# Patient Record
Sex: Female | Born: 1985 | Hispanic: No | Marital: Married | Smoking: Never smoker
Health system: Southern US, Community
[De-identification: ages and names within clinical notes are randomized; demographics above are authoritative.]

## PROBLEM LIST (undated history)

## (undated) DIAGNOSIS — I10 Essential (primary) hypertension: Secondary | ICD-10-CM

## (undated) DIAGNOSIS — O24419 Gestational diabetes mellitus in pregnancy, unspecified control: Secondary | ICD-10-CM

## (undated) DIAGNOSIS — K219 Gastro-esophageal reflux disease without esophagitis: Secondary | ICD-10-CM

## (undated) DIAGNOSIS — D649 Anemia, unspecified: Secondary | ICD-10-CM

## (undated) DIAGNOSIS — D561 Beta thalassemia: Secondary | ICD-10-CM

## (undated) HISTORY — DX: Beta thalassemia: D56.1

## (undated) HISTORY — DX: Gestational diabetes mellitus in pregnancy, unspecified control: O24.419

## (undated) HISTORY — DX: Gastro-esophageal reflux disease without esophagitis: K21.9

## (undated) HISTORY — DX: Essential (primary) hypertension: I10

## (undated) HISTORY — DX: Anemia, unspecified: D64.9

---

## 2020-08-05 NOTE — L&D Delivery Note (Addendum)
Delivery Note  Candice Walker is a G7P6 at [redacted]w[redacted]d with an LMP of 08/23/20, inconsistent with [place] at [redacted]w[redacted]d.   First Stage: Labor onset: 04/28/21@2210  Induction: misoprostol, oxytocin, AROM, and cervical balloon Analgesia /Anesthesia intrapartum: Epidural AROM at 0029  Second Stage: Complete dilation at 0738 Onset of pushing at 0751 FHR second stage 145 with variable x1  Delivery of a viable baby boy on 04/29/2021  at 0758 by CNM/ Dr Dalbert Garnet present Delivery of fetal head in OA position with restitution to LOT. reducible nuchal cord x1;  Anterior then posterior shoulders delivered easily with gentle downward traction. Baby placed on mom's chest, and attended to by baby RN Cord double clamped after cessation of pulsation, cut by FOB  Cord blood sample collected: yes Collection of cord blood donation N/A Arterial cord blood sample N/A  Third Stage: Oxytocin bolus started after delivery of infant for hemorrhage prophylaxis  Placenta delivered spontaneously intact with 3 VC @ 0802 Placenta disposition: discarded Uterine tone firm / bleeding small  no laceration identified  Anesthesia for repair: N/A Repair N/A Est. Blood Loss (mL): 250  Complications: none  Mom to postpartum.  Baby to Couplet care / Skin to Skin.  Newborn: Information for the patient's newborn:  Candice, Walker [[id number]]  Live born female  Birth Weight:   APGAR: 8, 9  Newborn Delivery   Birth date/time: 04/29/2021 07:58:00 Delivery type: Vaginal, Spontaneous        Feeding planned: Breast  ---------- Chari Manning, CNM Certified Nurse Midwife [facility]

## 2020-10-12 LAB — OB RESULTS CONSOLE HIV ANTIBODY (ROUTINE TESTING): HIV: NONREACTIVE

## 2021-01-03 DIAGNOSIS — Z419 Encounter for procedure for purposes other than remedying health state, unspecified: Secondary | ICD-10-CM | POA: Diagnosis not present

## 2021-01-05 DIAGNOSIS — O99012 Anemia complicating pregnancy, second trimester: Secondary | ICD-10-CM | POA: Diagnosis not present

## 2021-01-05 DIAGNOSIS — O10912 Unspecified pre-existing hypertension complicating pregnancy, second trimester: Secondary | ICD-10-CM | POA: Diagnosis not present

## 2021-01-05 DIAGNOSIS — O09892 Supervision of other high risk pregnancies, second trimester: Secondary | ICD-10-CM | POA: Diagnosis not present

## 2021-01-05 DIAGNOSIS — Z3482 Encounter for supervision of other normal pregnancy, second trimester: Secondary | ICD-10-CM | POA: Diagnosis not present

## 2021-01-05 DIAGNOSIS — R1084 Generalized abdominal pain: Secondary | ICD-10-CM | POA: Diagnosis not present

## 2021-01-05 DIAGNOSIS — Z8759 Personal history of other complications of pregnancy, childbirth and the puerperium: Secondary | ICD-10-CM | POA: Diagnosis not present

## 2021-01-05 DIAGNOSIS — Z114 Encounter for screening for human immunodeficiency virus [HIV]: Secondary | ICD-10-CM | POA: Diagnosis not present

## 2021-01-05 LAB — OB RESULTS CONSOLE RUBELLA ANTIBODY, IGM: Rubella: IMMUNE

## 2021-01-05 LAB — OB RESULTS CONSOLE HEPATITIS B SURFACE ANTIGEN: Hepatitis B Surface Ag: NEGATIVE

## 2021-01-05 LAB — OB RESULTS CONSOLE VARICELLA ZOSTER ANTIBODY, IGG: Varicella: IMMUNE

## 2021-01-05 LAB — OB RESULTS CONSOLE HIV ANTIBODY (ROUTINE TESTING): HIV: NONREACTIVE

## 2021-01-09 DIAGNOSIS — O09899 Supervision of other high risk pregnancies, unspecified trimester: Secondary | ICD-10-CM | POA: Insufficient documentation

## 2021-01-11 DIAGNOSIS — D649 Anemia, unspecified: Secondary | ICD-10-CM | POA: Diagnosis not present

## 2021-01-11 DIAGNOSIS — O09892 Supervision of other high risk pregnancies, second trimester: Secondary | ICD-10-CM | POA: Diagnosis not present

## 2021-01-11 DIAGNOSIS — Z131 Encounter for screening for diabetes mellitus: Secondary | ICD-10-CM | POA: Diagnosis not present

## 2021-01-11 DIAGNOSIS — Z1329 Encounter for screening for other suspected endocrine disorder: Secondary | ICD-10-CM | POA: Diagnosis not present

## 2021-01-11 DIAGNOSIS — O99012 Anemia complicating pregnancy, second trimester: Secondary | ICD-10-CM | POA: Diagnosis not present

## 2021-01-11 DIAGNOSIS — Z8759 Personal history of other complications of pregnancy, childbirth and the puerperium: Secondary | ICD-10-CM | POA: Diagnosis not present

## 2021-01-17 ENCOUNTER — Inpatient Hospital Stay: Payer: Medicaid Other

## 2021-01-17 ENCOUNTER — Inpatient Hospital Stay: Payer: Medicaid Other | Admitting: Oncology

## 2021-02-01 ENCOUNTER — Encounter (INDEPENDENT_AMBULATORY_CARE_PROVIDER_SITE_OTHER): Payer: Self-pay

## 2021-02-01 ENCOUNTER — Inpatient Hospital Stay: Payer: Medicaid Other | Attending: Nurse Practitioner | Admitting: Nurse Practitioner

## 2021-02-01 ENCOUNTER — Inpatient Hospital Stay: Payer: Medicaid Other

## 2021-02-01 ENCOUNTER — Encounter: Payer: Self-pay | Admitting: Nurse Practitioner

## 2021-02-01 VITALS — BP 130/78 | HR 95 | Temp 99.0°F | Resp 16 | Ht 61.0 in | Wt 136.5 lb

## 2021-02-01 DIAGNOSIS — Z3A25 25 weeks gestation of pregnancy: Secondary | ICD-10-CM

## 2021-02-01 DIAGNOSIS — O99012 Anemia complicating pregnancy, second trimester: Secondary | ICD-10-CM | POA: Insufficient documentation

## 2021-02-01 DIAGNOSIS — D649 Anemia, unspecified: Secondary | ICD-10-CM

## 2021-02-01 DIAGNOSIS — D509 Iron deficiency anemia, unspecified: Secondary | ICD-10-CM | POA: Insufficient documentation

## 2021-02-01 LAB — CBC WITH DIFFERENTIAL/PLATELET
Abs Immature Granulocytes: 0.07 10*3/uL (ref 0.00–0.07)
Basophils Absolute: 0 10*3/uL (ref 0.0–0.1)
Basophils Relative: 0 %
Eosinophils Absolute: 0.1 10*3/uL (ref 0.0–0.5)
Eosinophils Relative: 1 %
HCT: 27.8 % — ABNORMAL LOW (ref 36.0–46.0)
Hemoglobin: 8.8 g/dL — ABNORMAL LOW (ref 12.0–15.0)
Immature Granulocytes: 1 %
Lymphocytes Relative: 26 %
Lymphs Abs: 2 10*3/uL (ref 0.7–4.0)
MCH: 19.4 pg — ABNORMAL LOW (ref 26.0–34.0)
MCHC: 31.7 g/dL (ref 30.0–36.0)
MCV: 61.2 fL — ABNORMAL LOW (ref 80.0–100.0)
Monocytes Absolute: 0.6 10*3/uL (ref 0.1–1.0)
Monocytes Relative: 7 %
Neutro Abs: 4.9 10*3/uL (ref 1.7–7.7)
Neutrophils Relative %: 65 %
Platelets: 190 10*3/uL (ref 150–400)
RBC: 4.54 MIL/uL (ref 3.87–5.11)
RDW: 17.5 % — ABNORMAL HIGH (ref 11.5–15.5)
WBC: 7.6 10*3/uL (ref 4.0–10.5)
nRBC: 0 % (ref 0.0–0.2)

## 2021-02-01 LAB — COMPREHENSIVE METABOLIC PANEL
ALT: 10 U/L (ref 0–44)
AST: 17 U/L (ref 15–41)
Albumin: 3.4 g/dL — ABNORMAL LOW (ref 3.5–5.0)
Alkaline Phosphatase: 57 U/L (ref 38–126)
Anion gap: 6 (ref 5–15)
BUN: 11 mg/dL (ref 6–20)
CO2: 20 mmol/L — ABNORMAL LOW (ref 22–32)
Calcium: 8.6 mg/dL — ABNORMAL LOW (ref 8.9–10.3)
Chloride: 105 mmol/L (ref 98–111)
Creatinine, Ser: 0.52 mg/dL (ref 0.44–1.00)
GFR, Estimated: >60 mL/min (ref >60)
Glucose, Bld: 126 mg/dL — ABNORMAL HIGH (ref 70–99)
Potassium: 3.3 mmol/L — ABNORMAL LOW (ref 3.5–5.1)
Sodium: 131 mmol/L — ABNORMAL LOW (ref 135–145)
Total Bilirubin: 0.6 mg/dL (ref 0.3–1.2)
Total Protein: 6.8 g/dL (ref 6.5–8.1)

## 2021-02-01 LAB — RETICULOCYTES
Immature Retic Fract: 31.3 % — ABNORMAL HIGH (ref 2.3–15.9)
RBC.: 4.49 MIL/uL (ref 3.87–5.11)
Retic Count, Absolute: 135.1 10*3/uL (ref 19.0–186.0)
Retic Ct Pct: 3 % (ref 0.4–3.1)

## 2021-02-01 LAB — IRON AND TIBC
Iron: 160 ug/dL (ref 28–170)
Saturation Ratios: 33 % — ABNORMAL HIGH (ref 10.4–31.8)
TIBC: 480 ug/dL — ABNORMAL HIGH (ref 250–450)
UIBC: 320 ug/dL

## 2021-02-01 LAB — FERRITIN: Ferritin: 9 ng/mL — ABNORMAL LOW (ref 11–307)

## 2021-02-01 LAB — LACTATE DEHYDROGENASE: LDH: 100 U/L (ref 98–192)

## 2021-02-01 LAB — DAT, POLYSPECIFIC AHG (ARMC ONLY): Polyspecific AHG test: NEGATIVE

## 2021-02-01 LAB — VITAMIN B12: Vitamin B-12: 284 pg/mL (ref 180–914)

## 2021-02-01 LAB — FOLATE: Folate: 56 ng/mL (ref >5.9)

## 2021-02-01 LAB — TYPE AND SCREEN
ABO/RH(D): O POS
Antibody Screen: NEGATIVE

## 2021-02-01 LAB — SEDIMENTATION RATE: Sed Rate: 33 mm/hr — ABNORMAL HIGH (ref 0–20)

## 2021-02-01 NOTE — Progress Notes (Signed)
[facility] at [facility] [address] [place], [place] [address] [phone] (phone) [phone] (fax)  Clinic Day:  02/01/2021  Referring physician: Randa Ngo, CNM   CHIEF COMPLAINT:  CC: Anemia  HISTORY OF PRESENT ILLNESS:  Candice Walker is a 35 y.o. female with a history of anemia, hypertension, beta thalassemia trait, gerd, who is referred in consultation with Candice Walker, CNM for assessment and management of anemia. Patient is a refugee from [place] and curerntly living locally and has a local host family, who accompanies her today and contributes to history. Patient is currently [redacted] weeks pregnant with estimated delivery date of 05/17/21. She is [id number]. Has been anemic in previous pregnancies and out of pregnancy. She has been seen at [facility] in high risk pregnancy clinic. She has a history of heavy menstrual periods as well. Per patient she has a history of thalassemia. She has previously taken oral iron but was unable to tolerate due to Gi side effects. Endorses restless leg & cramping. Endorses previous pica: ice and dirt. No melena or hematochezia. No dizziness or falls. No recent fevers or illness. No easy bleeding or bruising. Appetite is good and she denies weight loss. Denies chest pain. Shortness of breath with exertion. Denies nausea, vomiting, constipation, or diarrhea. Denies urinary complaints. Denies other specific complaints today.   REVIEW OF SYSTEMS:  Review of Systems  Constitutional:  Positive for fatigue. Negative for appetite change and unexpected weight change.  HENT:   Negative for mouth sores, sore throat and trouble swallowing.   Respiratory:  Positive for shortness of breath. Negative for chest tightness and cough.   Cardiovascular:  Negative for leg swelling and palpitations.  Gastrointestinal:  Negative for abdominal pain, constipation, diarrhea, nausea and vomiting.  Genitourinary:  Negative for bladder  incontinence, dysuria, hematuria, menstrual problem, pelvic pain, vaginal bleeding and vaginal discharge.   Musculoskeletal:  Negative for flank pain and neck stiffness.  Skin:  Negative for itching, rash and wound.  Neurological:  Negative for dizziness, extremity weakness, headaches, light-headedness and numbness.  Hematological:  Negative for adenopathy. Does not bruise/bleed easily.  Psychiatric/Behavioral:  Negative for confusion, depression and sleep disturbance. The patient is not nervous/anxious.     History:    Past Medical History:  Diagnosis Date   Anemia    Beta thalassemia (HCC)    GERD (gastroesophageal reflux disease)    Hypertension    History reviewed. No pertinent surgical history.  Family History  Problem Relation Age of Onset   Hypertension Mother    OB History     Gravida  7   Para  6   Term      Preterm      AB      Living  6      SAB      IAB      Ectopic      Multiple      Live Births             Current Outpatient Medications on File Prior to Visit  Medication Sig Dispense Refill   aspirin 81 MG EC tablet Take 1 tablet by mouth.     Blood Pressure Monitor MISC To monitor blood pressure at home during pregnancy.     calcium carbonate (TUMS EX) 750 MG chewable tablet Chew by mouth.     fluticasone (FLONASE) 50 MCG/ACT nasal spray Place into the nose.     folic acid (FOLVITE) 1 MG tablet Take  by mouth.     labetalol (NORMODYNE) 100 MG tablet Take by mouth.     omeprazole (PRILOSEC) 40 MG capsule Take 1 capsule by mouth.     Prenatal 27-1 MG TABS 1 tablet.     No current facility-administered medications on file prior to visit.   No Known Allergies  Social History   Socioeconomic History   Marital status: Married    Spouse name: Not on file   Number of children: 6   Years of education: Not on file   Highest education level: Not on file  Occupational History   Not on file  Tobacco Use   Smoking status: Never    Passive  exposure: Never   Smokeless tobacco: Never  Vaping Use   Vaping Use: Never used  Substance and Sexual Activity   Alcohol use: Never   Drug use: Never   Sexual activity: Yes    Birth control/protection: None  Other Topics Concern   Not on file  Social History Narrative   Patient previously lived in [place] and left as refugee. Resided in refugee camp in [place], then in [place] and now lives in [place] with a host family along with her husband and children.    Social Determinants of Health   Financial Resource Strain: Not on file  Food Insecurity: Not on file  Transportation Needs: Not on file  Physical Activity: Not on file  Stress: Not on file  Social Connections: Not on file     VITALS:  Blood pressure 130/78, pulse 95, temperature 99 F (37.2 C), temperature source Tympanic, resp. rate 16, height 5\' 1"  (1.549 m), weight 136 lb 8 oz (61.9 kg), SpO2 100 %.  Wt Readings from Last 3 Encounters:  02/01/21 136 lb 8 oz (61.9 kg)    Body mass index is 25.79 kg/m.  Performance status (ECOG): 2 - Symptomatic, <50% confined to bed  PHYSICAL EXAM:  Physical Exam Vitals and nursing note reviewed.  Constitutional:      Appearance: She is not ill-appearing.  HENT:     Head: Normocephalic.  Eyes:     General: No scleral icterus.    Conjunctiva/sclera: Conjunctivae normal.  Cardiovascular:     Rate and Rhythm: Normal rate and regular rhythm.     Pulses: Normal pulses.  Pulmonary:     Effort: Pulmonary effort is normal. No respiratory distress.     Breath sounds: Normal breath sounds.  Abdominal:     General: There is no distension.     Tenderness: There is no abdominal tenderness. There is no guarding.     Comments: gravid  Musculoskeletal:        General: No swelling or deformity.     Cervical back: Neck supple.     Right lower leg: No edema.     Left lower leg: No edema.  Lymphadenopathy:     Cervical: No cervical adenopathy.  Skin:    General: Skin is warm and  dry.     Coloration: Skin is pale.     Findings: No bruising or rash.  Neurological:     General: No focal deficit present.     Mental Status: She is alert and oriented to person, place, and time.     Gait: Gait normal.  Psychiatric:        Behavior: Behavior normal.        Thought Content: Thought content normal.        Judgment: Judgment normal.    LABS:  CBC Latest Ref Rng & Units 02/01/2021  WBC 4.0 - 10.5 K/uL 7.6  Hemoglobin 12.0 - 15.0 g/dL 8.6(P)  Hematocrit 61.9 - 46.0 % 27.8(L)  Platelets 150 - 400 K/uL 190   CMP Latest Ref Rng & Units 02/01/2021  Glucose 70 - 99 mg/dL 509(T)  BUN 6 - 20 mg/dL 11  Creatinine 2.67 - 1.24 mg/dL 5.80  Sodium 998 - 338 mmol/L 131(L)  Potassium 3.5 - 5.1 mmol/L 3.3(L)  Chloride 98 - 111 mmol/L 105  CO2 22 - 32 mmol/L 20(L)  Calcium 8.9 - 10.3 mg/dL 2.5(K)  Total Protein 6.5 - 8.1 g/dL 6.8  Total Bilirubin 0.3 - 1.2 mg/dL 0.6  Alkaline Phos 38 - 126 U/L 57  AST 15 - 41 U/L 17  ALT 0 - 44 U/L 10   No results found for: CEA1 / No results found for: CEA1 No results found for: PSA1 No results found for: NLZ767 No results found for: HAL937  Lab Results  Component Value Date   TOTALPROTELP 6.1 02/01/2021   ALBUMINELP 3.3 02/01/2021   A1GS 0.3 02/01/2021   A2GS 0.7 02/01/2021   BETS 1.1 02/01/2021   GAMS 0.7 02/01/2021   MSPIKE Not Observed 02/01/2021   SPEI Comment 02/01/2021   Lab Results  Component Value Date   TIBC 480 (H) 02/01/2021   FERRITIN 9 (L) 02/01/2021   IRONPCTSAT 33 (H) 02/01/2021   Lab Results  Component Value Date   LDH 100 02/01/2021    STUDIES:  No results found.   ASSESSMENT & PLAN:   Assessment:  Candice Walker is a 35 y.o. female with anemia of pregnancy who presents for new patient evaluation.   Anemia: today we reviewed possible etiologies of anemia including possibly multifactorial with possible causes including chronic blood loss, hyper/hypothyroidism, nutritional deficiency, infection/chronic  inflammation, hemolysis, underlying bone marrow disorders. Will plan for labs today: CBC w differential, CMP, vitamin B12, Folate, iron/TIBC, ferritin, reticulocytes, fecal occult, blood smear, TSH,  LDH, haptoglobin, monoclonal gammopathy evaluation, DAT, Hgb fractionation cascade to evaluate for thalassemia, ANA. 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 However, patient is also significantly iron deficient. Iron deficiency can skew the hemoglobin pattern of thalassemia and should be corrected prior to interpreting thalassemia syndrome. Hemoglobin 8.8 consistent with anemia, microcytic and hypochromic. RDW is elevated which is more consistent with IDA compared to thalassemia minor. Additionally, ferritin and tsat are low consistent with IDA. Once iron deficiency is corrected, would recommend re-evaluating for thalassemia.  Given symptoms and poor response to oral iron I recommend rapid repletion of iron stores. Risks vs benefits and alternatives were discussed in detail. Based on insurance, recommend venofer x 5 doses. Will then plan to check her counts in 8 weeks and have her see MD to establish care.   Plan: Labs today Venofer x 5 8 weeks - labs (cbc, ferritin, iron studies, retic panel), MD to establish care, possible venofer  Due to a language barrier, an interpreter was utilized for all patient interactions.   I discussed the assessment and treatment plan with the patient.  The patient was provided an opportunity to ask questions and all were answered.  The patient agreed with the plan and demonstrated an understanding of the instructions.  The patient was advised to call back if the symptoms worsen or if the condition fails to improve as anticipated.  Thank you for the opportunity to participate in the care of this very pleasant patient.    I spent 80 minutes face-to-face  visit time dedicated to the care of this patient on the date of this encounter to including pre-visit review  of gynecology notes, face-to-face time with the patient, and post visit ordering of testing/documentation.    Consuello Masse, DNP, AGNP-C Cancer Center at [facility]

## 2021-02-01 NOTE — Progress Notes (Signed)
Recent immigration from [place]. Language barrier. Used electronic interpreter ID [id number]. Pt has 6 children at home ages 80-14. Currently 6 months pregnant. Long history of anemia. Does state she has some lightheadedness, heart beats fast. Denies any visible bleeding from vagina or rectal bleeding. Reports back and leg pains. Feels has problem with constipation, a friend from local church is with patient today.

## 2021-02-02 DIAGNOSIS — Z419 Encounter for procedure for purposes other than remedying health state, unspecified: Secondary | ICD-10-CM | POA: Diagnosis not present

## 2021-02-02 LAB — PROTEIN ELECTROPHORESIS, SERUM
A/G Ratio: 1.2 (ref 0.7–1.7)
Albumin ELP: 3.3 g/dL (ref 2.9–4.4)
Alpha-1-Globulin: 0.3 g/dL (ref 0.0–0.4)
Alpha-2-Globulin: 0.7 g/dL (ref 0.4–1.0)
Beta Globulin: 1.1 g/dL (ref 0.7–1.3)
Gamma Globulin: 0.7 g/dL (ref 0.4–1.8)
Globulin, Total: 2.8 g/dL (ref 2.2–3.9)
Total Protein ELP: 6.1 g/dL (ref 6.0–8.5)

## 2021-02-02 LAB — ERYTHROPOIETIN: Erythropoietin: 28.5 m[IU]/mL — ABNORMAL HIGH (ref 2.6–18.5)

## 2021-02-02 LAB — ANA W/REFLEX: Anti Nuclear Antibody (ANA): NEGATIVE

## 2021-02-02 LAB — HAPTOGLOBIN: Haptoglobin: 37 mg/dL (ref 33–278)

## 2021-02-05 LAB — HGB FRACTIONATION CASCADE
Hgb A2: 5.1 % — ABNORMAL HIGH (ref 1.8–3.2)
Hgb A: 94.9 % — ABNORMAL LOW (ref 96.4–98.8)
Hgb F: 0 % (ref 0.0–2.0)
Hgb S: 0 %

## 2021-02-07 ENCOUNTER — Encounter: Payer: Self-pay | Admitting: Nurse Practitioner

## 2021-02-07 DIAGNOSIS — O99019 Anemia complicating pregnancy, unspecified trimester: Secondary | ICD-10-CM | POA: Insufficient documentation

## 2021-02-07 DIAGNOSIS — D509 Iron deficiency anemia, unspecified: Secondary | ICD-10-CM | POA: Insufficient documentation

## 2021-02-08 ENCOUNTER — Inpatient Hospital Stay: Payer: Medicaid Other | Attending: Nurse Practitioner

## 2021-02-08 ENCOUNTER — Other Ambulatory Visit: Payer: Self-pay

## 2021-02-08 VITALS — BP 141/73 | HR 90

## 2021-02-08 DIAGNOSIS — D649 Anemia, unspecified: Secondary | ICD-10-CM

## 2021-02-08 DIAGNOSIS — D509 Iron deficiency anemia, unspecified: Secondary | ICD-10-CM | POA: Diagnosis not present

## 2021-02-08 MED ORDER — SODIUM CHLORIDE 0.9 % IV SOLN: 200.0000 mg | Freq: Once | INTRAVENOUS | Status: DC

## 2021-02-08 MED ORDER — SODIUM CHLORIDE 0.9 % IV SOLN
Freq: Once | INTRAVENOUS | Status: AC
  Filled 2021-02-08: qty 250

## 2021-02-08 MED ORDER — IRON SUCROSE 20 MG/ML IV SOLN
200.0000 mg | Freq: Once | INTRAVENOUS | Status: AC
Start: 2021-02-08 — End: 2021-02-08
  Administered 2021-02-08: 200 mg via INTRAVENOUS
  Filled 2021-02-08: qty 10

## 2021-02-08 NOTE — Patient Instructions (Addendum)
CANCER CENTER [facility] REGIONAL MEDICAL ONCOLOGY  Discharge Instructions: Thank you for choosing [facility] to provide your oncology and hematology care.  If you have a lab appointment with the Cancer Center, please go directly to the Cancer Center and check in at the registration area.  Wear comfortable clothing and clothing appropriate for easy access to any Portacath or PICC line.   We strive to give you quality time with your provider. You may need to reschedule your appointment if you arrive late (15 or more minutes).  Arriving late affects you and other patients whose appointments are after yours.  Also, if you miss three or more appointments without notifying the office, you may be dismissed from the clinic at the provider's discretion.      For prescription refill requests, have your pharmacy contact our office and allow 72 hours for refills to be completed.    You received Venofer in our office today   Should you have questions after your visit or need to cancel or reschedule your appointment, please contact CANCER CENTER [facility] REGIONAL MEDICAL ONCOLOGY  [phone] and follow the prompts.  Office hours are 8:00 a.m. to 4:30 p.m. Monday - Friday. Please note that voicemails left after 4:00 p.m. may not be returned until the following business day.  We are closed weekends and major holidays. You have access to a nurse at all times for urgent questions. Please call the main number to the clinic [phone] and follow the prompts.  For any non-urgent questions, you may also contact your provider using MyChart. We now offer e-Visits for anyone 24 and older to request care online for non-urgent symptoms. For details visit [email or web address].   Also download the MyChart app! Go to the app store, search "MyChart", open the app, select [place], and log in with your MyChart username and password.  Due to Covid, a mask is required upon entering the hospital/clinic. If  you do not have a mask, one will be given to you upon arrival. For doctor visits, patients may have 1 support person aged 60 or older with them. For treatment visits, patients cannot have anyone with them due to current Covid guidelines and our immunocompromised population.

## 2021-02-14 ENCOUNTER — Telehealth: Payer: Self-pay | Admitting: Internal Medicine

## 2021-02-14 NOTE — Telephone Encounter (Signed)
Spoke with patient's husband about rescheduling appointment for iron infusion from 7/21/to 7/22. He confirmed that was ok.

## 2021-02-15 ENCOUNTER — Other Ambulatory Visit: Payer: Self-pay

## 2021-02-15 ENCOUNTER — Inpatient Hospital Stay: Payer: Medicaid Other

## 2021-02-15 VITALS — BP 126/77 | HR 88

## 2021-02-15 DIAGNOSIS — D649 Anemia, unspecified: Secondary | ICD-10-CM

## 2021-02-15 DIAGNOSIS — D509 Iron deficiency anemia, unspecified: Secondary | ICD-10-CM | POA: Diagnosis not present

## 2021-02-15 MED ORDER — IRON SUCROSE 20 MG/ML IV SOLN
200.0000 mg | Freq: Once | INTRAVENOUS | Status: AC
  Administered 2021-02-15: 200 mg via INTRAVENOUS
  Filled 2021-02-15: qty 10

## 2021-02-15 MED ORDER — SODIUM CHLORIDE 0.9 % IV SOLN: 200.0000 mg | Freq: Once | INTRAVENOUS | Status: DC

## 2021-02-15 MED ORDER — SODIUM CHLORIDE 0.9 % IV SOLN
Freq: Once | INTRAVENOUS | Status: AC
  Filled 2021-02-15: qty 250

## 2021-02-15 NOTE — Patient Instructions (Signed)
CANCER CENTER [place] REGIONAL MEDICAL ONCOLOGY  Discharge Instructions: Thank you for choosing [facility] to provide your oncology and hematology care.  If you have a lab appointment with the Cancer Center, please go directly to the Cancer Center and check in at the registration area.  Wear comfortable clothing and clothing appropriate for easy access to any Portacath or PICC line.   We strive to give you quality time with your provider. You may need to reschedule your appointment if you arrive late (15 or more minutes).  Arriving late affects you and other patients whose appointments are after yours.  Also, if you miss three or more appointments without notifying the office, you may be dismissed from the clinic at the provider's discretion.      For prescription refill requests, have your pharmacy contact our office and allow 72 hours for refills to be completed.    Today you received the following : Venofer   To help prevent nausea and vomiting after your treatment, we encourage you to take your nausea medication as directed.  BELOW ARE SYMPTOMS THAT SHOULD BE REPORTED IMMEDIATELY: . *FEVER GREATER THAN 100.4 F (38 C) OR HIGHER . *CHILLS OR SWEATING . *NAUSEA AND VOMITING THAT IS NOT CONTROLLED WITH YOUR NAUSEA MEDICATION . *UNUSUAL SHORTNESS OF BREATH . *UNUSUAL BRUISING OR BLEEDING . *URINARY PROBLEMS (pain or burning when urinating, or frequent urination) . *BOWEL PROBLEMS (unusual diarrhea, constipation, pain near the anus) . TENDERNESS IN MOUTH AND THROAT WITH OR WITHOUT PRESENCE OF ULCERS (sore throat, sores in mouth, or a toothache) . UNUSUAL RASH, SWELLING OR PAIN  . UNUSUAL VAGINAL DISCHARGE OR ITCHING   Items with * indicate a potential emergency and should be followed up as soon as possible or go to the Emergency Department if any problems should occur.  Please show the CHEMOTHERAPY ALERT CARD or IMMUNOTHERAPY ALERT CARD at check-in to the Emergency  Department and triage nurse.  Should you have questions after your visit or need to cancel or reschedule your appointment, please contact CANCER CENTER [place] REGIONAL MEDICAL ONCOLOGY  [phone] and follow the prompts.  Office hours are 8:00 a.m. to 4:30 p.m. Monday - Friday. Please note that voicemails left after 4:00 p.m. may not be returned until the following business day.  We are closed weekends and major holidays. You have access to a nurse at all times for urgent questions. Please call the main number to the clinic [phone] and follow the prompts.  For any non-urgent questions, you may also contact your provider using MyChart. We now offer e-Visits for anyone 18 and older to request care online for non-urgent symptoms. For details visit [email or web address].   Also download the MyChart app! Go to the app store, search "MyChart", open the app, select [place], and log in with your MyChart username and password.  Due to Covid, a mask is required upon entering the hospital/clinic. If you do not have a mask, one will be given to you upon arrival. For doctor visits, patients may have 1 support person aged 18 or older with them. For treatment visits, patients cannot have anyone with them due to current Covid guidelines and our immunocompromised population.  

## 2021-02-21 DIAGNOSIS — O99012 Anemia complicating pregnancy, second trimester: Secondary | ICD-10-CM | POA: Diagnosis not present

## 2021-02-21 DIAGNOSIS — O10912 Unspecified pre-existing hypertension complicating pregnancy, second trimester: Secondary | ICD-10-CM | POA: Diagnosis not present

## 2021-02-21 DIAGNOSIS — Z23 Encounter for immunization: Secondary | ICD-10-CM | POA: Diagnosis not present

## 2021-02-21 DIAGNOSIS — Z3483 Encounter for supervision of other normal pregnancy, third trimester: Secondary | ICD-10-CM | POA: Diagnosis not present

## 2021-02-22 ENCOUNTER — Inpatient Hospital Stay: Payer: Medicaid Other

## 2021-02-22 ENCOUNTER — Encounter (INDEPENDENT_AMBULATORY_CARE_PROVIDER_SITE_OTHER): Payer: Self-pay

## 2021-02-23 ENCOUNTER — Other Ambulatory Visit: Payer: Self-pay

## 2021-02-23 ENCOUNTER — Inpatient Hospital Stay: Payer: Medicaid Other

## 2021-02-23 VITALS — BP 109/69 | HR 89 | Temp 97.4°F | Resp 17

## 2021-02-23 DIAGNOSIS — D509 Iron deficiency anemia, unspecified: Secondary | ICD-10-CM | POA: Diagnosis not present

## 2021-02-23 DIAGNOSIS — D649 Anemia, unspecified: Secondary | ICD-10-CM

## 2021-02-23 MED ORDER — SODIUM CHLORIDE 0.9 % IV SOLN
Freq: Once | INTRAVENOUS | Status: AC
  Filled 2021-02-23: qty 250

## 2021-02-23 MED ORDER — SODIUM CHLORIDE 0.9 % IV SOLN: 200.0000 mg | Freq: Once | INTRAVENOUS | Status: DC

## 2021-02-23 MED ORDER — IRON SUCROSE 20 MG/ML IV SOLN
200.0000 mg | Freq: Once | INTRAVENOUS | Status: AC
  Administered 2021-02-23: 200 mg via INTRAVENOUS
  Filled 2021-02-23: qty 10

## 2021-02-23 NOTE — Progress Notes (Signed)
Used Interpreter [employer] (ref. # [id number]) for communication during visit.

## 2021-02-27 ENCOUNTER — Other Ambulatory Visit: Payer: Self-pay | Admitting: Obstetrics and Gynecology

## 2021-02-27 ENCOUNTER — Telehealth: Payer: Self-pay | Admitting: *Deleted

## 2021-02-27 DIAGNOSIS — O09522 Supervision of elderly multigravida, second trimester: Secondary | ICD-10-CM

## 2021-02-27 DIAGNOSIS — O24419 Gestational diabetes mellitus in pregnancy, unspecified control: Secondary | ICD-10-CM

## 2021-02-27 DIAGNOSIS — O99019 Anemia complicating pregnancy, unspecified trimester: Secondary | ICD-10-CM

## 2021-02-27 DIAGNOSIS — D509 Iron deficiency anemia, unspecified: Secondary | ICD-10-CM

## 2021-02-27 DIAGNOSIS — O162 Unspecified maternal hypertension, second trimester: Secondary | ICD-10-CM

## 2021-02-27 NOTE — Telephone Encounter (Signed)
Patty called reporting that patient has now been referred to the high risk maternity clinic due to elevated glucose and need to be on insulin injections as well as her hypertension. She was asking which appts would take precedence the iron infusion or the high risk clinic. I advised that both are important and we can work together to get the patient the care she requires. She stated that she wanted to be sure we were aware of the new situation and appointments for this patient

## 2021-03-01 ENCOUNTER — Other Ambulatory Visit: Payer: Self-pay | Admitting: Obstetrics and Gynecology

## 2021-03-01 ENCOUNTER — Ambulatory Visit (HOSPITAL_BASED_OUTPATIENT_CLINIC_OR_DEPARTMENT_OTHER): Payer: Medicaid Other

## 2021-03-01 ENCOUNTER — Inpatient Hospital Stay: Payer: Medicaid Other

## 2021-03-01 ENCOUNTER — Ambulatory Visit (HOSPITAL_BASED_OUTPATIENT_CLINIC_OR_DEPARTMENT_OTHER): Payer: Medicaid Other | Admitting: Obstetrics and Gynecology

## 2021-03-01 ENCOUNTER — Other Ambulatory Visit: Payer: Self-pay

## 2021-03-01 VITALS — BP 113/72 | HR 90 | Temp 98.1°F | Resp 20 | Wt 142.0 lb

## 2021-03-01 VITALS — BP 108/72 | HR 98 | Resp 18

## 2021-03-01 DIAGNOSIS — O99012 Anemia complicating pregnancy, second trimester: Secondary | ICD-10-CM

## 2021-03-01 DIAGNOSIS — O352XX0 Maternal care for (suspected) hereditary disease in fetus, not applicable or unspecified: Secondary | ICD-10-CM | POA: Diagnosis not present

## 2021-03-01 DIAGNOSIS — O99013 Anemia complicating pregnancy, third trimester: Secondary | ICD-10-CM

## 2021-03-01 DIAGNOSIS — O0943 Supervision of pregnancy with grand multiparity, third trimester: Secondary | ICD-10-CM

## 2021-03-01 DIAGNOSIS — O0942 Supervision of pregnancy with grand multiparity, second trimester: Secondary | ICD-10-CM

## 2021-03-01 DIAGNOSIS — O163 Unspecified maternal hypertension, third trimester: Secondary | ICD-10-CM | POA: Insufficient documentation

## 2021-03-01 DIAGNOSIS — O09523 Supervision of elderly multigravida, third trimester: Secondary | ICD-10-CM | POA: Diagnosis not present

## 2021-03-01 DIAGNOSIS — Z3A29 29 weeks gestation of pregnancy: Secondary | ICD-10-CM

## 2021-03-01 DIAGNOSIS — O10012 Pre-existing essential hypertension complicating pregnancy, second trimester: Secondary | ICD-10-CM

## 2021-03-01 DIAGNOSIS — O09522 Supervision of elderly multigravida, second trimester: Secondary | ICD-10-CM

## 2021-03-01 DIAGNOSIS — D509 Iron deficiency anemia, unspecified: Secondary | ICD-10-CM

## 2021-03-01 DIAGNOSIS — O24414 Gestational diabetes mellitus in pregnancy, insulin controlled: Secondary | ICD-10-CM

## 2021-03-01 DIAGNOSIS — O24419 Gestational diabetes mellitus in pregnancy, unspecified control: Secondary | ICD-10-CM | POA: Diagnosis not present

## 2021-03-01 DIAGNOSIS — O10013 Pre-existing essential hypertension complicating pregnancy, third trimester: Secondary | ICD-10-CM

## 2021-03-01 DIAGNOSIS — O0992 Supervision of high risk pregnancy, unspecified, second trimester: Secondary | ICD-10-CM

## 2021-03-01 DIAGNOSIS — O162 Unspecified maternal hypertension, second trimester: Secondary | ICD-10-CM

## 2021-03-01 DIAGNOSIS — D649 Anemia, unspecified: Secondary | ICD-10-CM

## 2021-03-01 DIAGNOSIS — O10919 Unspecified pre-existing hypertension complicating pregnancy, unspecified trimester: Secondary | ICD-10-CM

## 2021-03-01 DIAGNOSIS — Z3A27 27 weeks gestation of pregnancy: Secondary | ICD-10-CM

## 2021-03-01 MED ORDER — IRON SUCROSE 20 MG/ML IV SOLN
200.0000 mg | Freq: Once | INTRAVENOUS | Status: AC
  Administered 2021-03-01: 200 mg via INTRAVENOUS
  Filled 2021-03-01: qty 10

## 2021-03-01 MED ORDER — SODIUM CHLORIDE 0.9 % IV SOLN
Freq: Once | INTRAVENOUS | Status: AC
  Filled 2021-03-01: qty 250

## 2021-03-01 MED ORDER — SODIUM CHLORIDE 0.9 % IV SOLN: 200.0000 mg | Freq: Once | INTRAVENOUS | Status: DC

## 2021-03-01 NOTE — Progress Notes (Signed)
Maternal-Fetal Medicine   Name: Candice Walker DOB: [birth year] MRN: [id number] Referring Provider: Heloise Ochoa, CNM  I had the pleasure of seeing Candice Walker today at Maternal Fetal Care, [facility].  She is G7 P6 at 28-weeks' gestation and is here for ultrasound evaluation of her pregnancy.  I obtained history and counseled her with help of phone language interpreter.  Patient does not want a female physician to perform ultrasound. Her problems include: -Chronic hypertension: Patient takes labetalol 50 mg twice daily. Blood pressure today at our office is 113/72 mm Hg.  She does not take low-dose aspirin prophylaxis. - Gestational diabetes. She had abnormal 1-hour glucose challenge screening (222 mg/dL).  Patient has an appointment with the diabetes clinic and will be learning blood glucose monitoring. -Advanced maternal age: On cell free fetal DNA screening, she had low risk for fetal aneuploidies. -Anemia: Significantly iron-deficient with other possible causes. Patient had hematology consultation (see 02/01/21 note in EPIC). She had iron infusion today. Most-recent hemoglobin was 8.8%. -Beta thalassemia trait. Obstetrical history significant for 6 term vaginal deliveries.  Patient had gestational diabetes and 3 of her pregnancies. Prenatal course: MSAFP screening showed low risk for open neural tube defects.  Her pregnancy is dated by early ultrasound (21 weeks) and her EDD is 05/17/2021. Ultrasound On today's ultrasound, fetal growth is appropriate for gestational age.  Amniotic fluid is normal and good fetal activity seen.  No obvious fetal structural defects are seen.  Fetal anatomical survey appears normal but limited by advanced gestational age. Our concerns include Chronic hypertension I counseled her on the possible adverse effects of uncontrolled hypertension (maternal).  Chronic hypertension can also be associated with fetal growth restriction, placental abruption and superimposed preeclampsia  (30%). I explained our ultrasound protocol of weekly BPP from [redacted] weeks gestation till delivery provider her blood pressures are well controlled, she can be delivered at [redacted] weeks gestation.  Early term delivery (37 to [redacted] weeks gestation) should be considered if hypertension is not well controlled. Gestational diabetes I explained the importance of good blood glucose control to prevent adverse fetal or neonatal outcomes.  I encouraged her to keep the appointment and check her blood glucose regularly.  I discussed the importance of diet, exercise and or oral hypoglycemics/insulin for control of diabetes.  I explained that in most cases diabetes resolves after delivery.  However, in about 25% of the cases, it can lead to type 2 diabetes and that we recommend screening 6 weeks after delivery. Advanced maternal age.   I briefly explained the significance of cell free fetal DNA screening that showed low risk for Down syndrome.  Anemia Discussed the importance of iron supplements (iron transfusions) to improve hemoglobin.  Patient is a grand multigravida and can have severe postpartum hemorrhage.  Iron supplements by increasing hemoglobin can avoid blood transfusions.  Recommendations -An appointment was made for her to return in 3 weeks for BPP and then weekly BPP till delivery. -Fetal growth assessment in 4 weeks. -Regular prenatal visits.  Thank you for consultation.  If you have any questions or concerns, please contact me the Center for Maternal-Fetal Care.  Consultation including face-to-face counseling (more than 50% of time spent) is 45 minutes.

## 2021-03-01 NOTE — Progress Notes (Signed)
Interpreter [employer] (ref. # [id number]) used during visit.   Patient reports at times she can feel her heart pounding. States that when she checks her HR it is 99-100. Patient reports that MD is aware and she is being followed closely with OBGYN.

## 2021-03-01 NOTE — Patient Instructions (Signed)
Iron Sucrose injection ?? ??? ??????? ????? ?????? ?? ?????? ??????. ?????? ?????? ?? ????? ????? ?? ????? ???? ???? ???????? ??????? ???????? ??? ???? ????? ?????. ?????? ??? ?????? ????? ????????? ?????? ??? ??????? ??????? ?????? ??????. ???? ??????? ??? ?????? ?????? ????? ???? ???? ??????? ?????? ?? ??????? ??????? ???? ?????. ????? ???????? ???????? ????????:? Venofer ?? ?? ??????? ???? ??? ?? ???? ??? ???? ??????? ?????? ??? ????? ??? ??????? ??????? ????? ?????? ?? ??? ??? ?????? ??? ?? ??? ???????: ??? ???? ?? ????? ??? ?????? ??????? ?????? ????? ????? ??????? ?????? ?? ?????? ?? ???? ????? ????? ????? ????? ?? ??? ??? ???? ?? ?????? ?? ?????? ?? ??? ??? ???? ?? ?????? ?? ????? ???? ?? ??? ??? ???? ?? ?????? ?? ??????? ?? ??????? ?? ?????? ??????? ???? ?? ????? ????? ??????? ???????? ??? ??? ???? ??????? ??? ??????? ??? ?????? ???? ??????? ???? ????. ??? ????? ??? ?????? ?? ???? ?????? ????????? ?? ?????? ?? ?????. ???? ??? ???? ??????? ???? ??????? ??? ?????? ???????. ?? ??? ?? ??? ?????? ???? ?? ???? ??????? ????? ?????? ?? ????? 2 ????? ????? ????? ??????? ??????????? ??? ???? ????? ?????????? ???????. ?????? ??????? : ??? ?????? ??? ?????? ???? ????? ???? ?? ??? ??????? ????????? ?????? ?? ?????? ?? ???? ??????? ?????. ??????: ?????? ??? ?????? ?? ???? ??? ???. ?? ????? ????? ??? ?? ????? ?????????. ???? ?? ???? (????) ????? ?? ????? ??? ???? ????. ???? ?????? ?? ?????? ??????? ?????? ??? ??? ??? ??????? ?????? ??? ????. ?? ?? ??????? ???? ?? ?????? ?? ??? ??????? ?? ????? ??? ?????? ?? ?? ??? ???: ???????????? ?????????? ?????? ?????? ?????? ??? ?????? ???? ?? ?????? ????? ?? ??????? ???????: ????????????? ???????????? ??? ??????? ?? ?? ??? ?? ????????? ????????. ?? ?????? ???? ??????? ?????? ????? ??? ??????? ?? ??????? ?? ??????? ???? ???? ?? ???????? ???????? ???? ????????. ?????? ????? ??? ??? ???? ?? ???? ?????? ?? ?????? ?????? ??????????. ?? ?????? ??? ??????? ??  ?????. ?? ???? ??? ???? ????? ??? ??????? ??? ??????? ?? ?????? ????? ?? ?????? ??????? ?????? ??????? ???? ???????. ???? ????? ?? ?????? ??????? ?????? ??? ?? ???? ?????? ?? ?????? ?? ??? ?????? ?????. ???????? ??? ????? ???????? ?????? ???? ????? ?????? ???? ??????. ?? ????? ??? ????? ???? ????? ???. ????? ??? ?????. ???? ??????? ???? ????? ??? ??????: ?????? ??????? ???????? ??????? ?????????? ?? ???????? ?????? ??????????????? ??????? (????? ??????). ?? ?? ?????? ???????? ???? ???? ?? ??????? ??? ???? ??? ??????? ?????? ???????? ???? ??? ?? ???? ????? ?? ?????? ??????? ?????? ??? ?? ???? ???????: ???????? ??? ????? ?????? ?????? ??????????? (?????) ???? ????? ?? ?????? ?? ??????. ????? ?????? ?????? ?? ??? ???? ???? ????? ????? ??????? ?? ??? ???????? ?????? ??????? ?? ????? ?? ???? ???? ?? ??????? ?????? ?? ??? ?? ??????? ???????? ??? ?? ??????? ?? ??????? ????? ?????? ??? ?????? ?? ????? ??? ?? ????? ??? ????? ?????? ???????? ???? ?? ????? ????? ???? (???? ????? ?? ?????? ??????? ????????? ?????? ?? ???? ????? ?????): ????? ?????? ?????? ?????? ?????? ?? ????? ????? ????? ?? ??? ?????? ?????? ?????? ?????? ??? ??????? ?? ?? ??? ?? ?????? ???????? ????????. ???? ?????? ????????? ?????? ?? ?????? ????????. ????? ??????? ?? ?????? ???????? ?????? ??????? ????????????????? (FDA) ??? ????? [phone]??? ??? ??? ???? ???????? ??????? ??? ????? ??? ?????? ?? ???? ?????? ????? ???? ?? ?????? ?? ?????. ?? ????????? ??? ?????? ??????? ?? ??????. ??????: ??? ??????? ????? ?? ????: ?? ?? ???? ??? ???????? ?? ????????? ???????. ??? ???? ???? ?? ????? ?? ??? ??????? ????? ??? ?????? ?? ??????? ?????? ??????? ??????.  2022 Elsevier/Gold Standard (2016-08-22 00:00:00)

## 2021-03-05 DIAGNOSIS — Z419 Encounter for procedure for purposes other than remedying health state, unspecified: Secondary | ICD-10-CM | POA: Diagnosis not present

## 2021-03-06 ENCOUNTER — Encounter: Payer: Self-pay | Admitting: *Deleted

## 2021-03-06 ENCOUNTER — Encounter: Payer: Medicaid Other | Attending: Obstetrics and Gynecology | Admitting: *Deleted

## 2021-03-06 ENCOUNTER — Other Ambulatory Visit: Payer: Self-pay

## 2021-03-06 VITALS — Ht 61.0 in | Wt 140.2 lb

## 2021-03-06 DIAGNOSIS — O24419 Gestational diabetes mellitus in pregnancy, unspecified control: Secondary | ICD-10-CM | POA: Insufficient documentation

## 2021-03-06 DIAGNOSIS — O24410 Gestational diabetes mellitus in pregnancy, diet controlled: Secondary | ICD-10-CM

## 2021-03-06 NOTE — Patient Instructions (Signed)
Read booklet on Gestational Diabetes Follow Gestational Meal Planning Guidelines Don't skip meals Avoid fruit juices Check blood sugars 4 x day - before breakfast and 2 hrs after every meal and record  Bring blood sugar log to all MD appointments Walk 20-30 minutes at least 5 x week if permitted by MD

## 2021-03-06 NOTE — Progress Notes (Signed)
Diabetes Self-Management Education  Visit Type: First/Initial  Appt. Start Time: 1030 Appt. End Time: 1220  03/06/2021  [name], identified by name and date of birth, is a 35 y.o. female with a diagnosis of Diabetes: Gestational Diabetes.   ASSESSMENT  Blood pressure (P) 112/70, height 5\' 1"  (1.549 m), weight 140 lb 3.2 oz (63.6 kg), estimated date of delivery 05/17/2021 Body mass index is 26.49 kg/m.   Diabetes Self-Management Education - 03/06/21 1337       Visit Information   Visit Type First/Initial      Initial Visit   Diabetes Type Gestational Diabetes    Are you currently following a meal plan? No    Are you taking your medications as prescribed? Yes    Date Diagnosed 1 week ago      Health Coping   How would you rate your overall health? Good      Psychosocial Assessment   Patient Belief/Attitude about Diabetes Defeat/Burnout   "tired"   Self-care barriers Low literacy;Other (comment)   Pashto language   Self-management support Friends;Family;Church;Doctor's office    Other persons present Interpreter;Spouse/SO;Other (comment)   [place] person from church was with patient and husband and intepreter 05/06/21   Patient Concerns Nutrition/Meal planning;Glycemic Control;Monitoring;Healthy Lifestyle;Other (comment)   ensure safe, healthy delivery   Special Needs Verbal instruction    Preferred Learning Style Hands on;Auditory    Learning Readiness Ready    How often do you need to have someone help you when you read instructions, pamphlets, or other written materials from your doctor or pharmacy? 5 - Always   Didn't go to school in [place]   What is the last grade level you completed in school? didn't attend school      Pre-Education Assessment   Patient understands the diabetes disease and treatment process. Needs Instruction    Patient understands incorporating nutritional management into lifestyle. Needs Instruction    Patient undertands incorporating  physical activity into lifestyle. Needs Instruction    Patient understands using medications safely. Needs Instruction    Patient understands monitoring blood glucose, interpreting and using results Needs Instruction    Patient understands prevention, detection, and treatment of acute complications. Needs Instruction    Patient understands prevention, detection, and treatment of chronic complications. Needs Instruction    Patient understands how to develop strategies to address psychosocial issues. Needs Instruction    Patient understands how to develop strategies to promote health/change behavior. Needs Instruction      Complications   Last HgB A1C per patient/outside source 5.3 %   01/11/2021   How often do you check your blood sugar? 0 times/day (not testing)   Pt brought her Accu-Chek meter to this appointment and was instructed on use. BG upon return demonstration was 84 mg/dL at 03/13/2021 pm - fasting.   Have you had a dilated eye exam in the past 12 months? No    Have you had a dental exam in the past 12 months? No    Are you checking your feet? Yes    How many days per week are you checking your feet? 6      Dietary Intake   Breakfast skips    Snack (morning) 1-2 snacks/day of fruit (dates, figs, orange, peach, plum, strawberries) and nuts    Lunch chicken, lamb, goat, lentils, spinach, beans, noodles, naan, curry, potatoes, rice, corn, cuccumbers, cauliflower, tomatoes, spinach, okra, zucchini, yogurt    Dinner same as lunch    Beverage(s) water,  tea, milk, juice      Exercise   Exercise Type Light (walking / raking leaves)    How many days per week to you exercise? 7    How many minutes per day do you exercise? 10    Total minutes per week of exercise 70      Patient Education   Previous Diabetes Education No    Disease state  Definition of diabetes, type 1 and 2, and the diagnosis of diabetes;Factors that contribute to the development of diabetes    Nutrition management  Role  of diet in the treatment of diabetes and the relationship between the three main macronutrients and blood glucose level;Food label reading, portion sizes and measuring food.;Reviewed blood glucose goals for pre and post meals and how to evaluate the patients' food intake on their blood glucose level.    Physical activity and exercise  Role of exercise on diabetes management, blood pressure control and cardiac health.    Medications Other (comment)   Limited use of oral medications during pregnancy with potential for insulin.   Monitoring Taught/evaluated SMBG meter.;Purpose and frequency of SMBG.;Taught/discussed recording of test results and interpretation of SMBG.;Identified appropriate SMBG and/or A1C goals.    Chronic complications Relationship between chronic complications and blood glucose control    Psychosocial adjustment Identified and addressed patients feelings and concerns about diabetes    Preconception care Pregnancy and GDM  Role of pre-pregnancy blood glucose control on the development of the fetus;Reviewed with patient blood glucose goals with pregnancy;Role of family planning for patients with diabetes      Individualized Goals (developed by patient)   Reducing Risk Other (comment)   improve blood sugars, prevent diabetes complications, lead a healthier lifestyle, ensure safe and healthy delivery     Outcomes   Expected Outcomes Demonstrated interest in learning. Expect positive outcomes    Program Status Not Completed             Individualized Plan for Diabetes Self-Management Training:   Learning Objective:  Patient will have a greater understanding of diabetes self-management. Patient education plan is to attend individual and/or group sessions per assessed needs and concerns.   Plan:   Patient Instructions  Read booklet on Gestational Diabetes Follow Gestational Meal Planning Guidelines Don't skip meals Avoid fruit juices Check blood sugars 4 x day - before  breakfast and 2 hrs after every meal and record  Bring blood sugar log to all MD appointments Walk 20-30 minutes at least 5 x week if permitted by MD  Expected Outcomes:  Demonstrated interest in learning. Expect positive outcomes  Education material provided:  Gestational Booklet Gestational Meal Planning Guidelines Simple Meal Plan [employer] Goals for a Healthy Pregnancy   If problems or questions, patient to contact team via:   Sharion Settler, RN, CCM, CDCES [phone]  Future DSME appointment: PRN - the patient and husband did not to schedule a follow up appointment with the dietitian at this time

## 2021-03-08 ENCOUNTER — Other Ambulatory Visit: Payer: Self-pay

## 2021-03-08 ENCOUNTER — Inpatient Hospital Stay: Payer: Medicaid Other | Attending: Internal Medicine

## 2021-03-08 DIAGNOSIS — Z79899 Other long term (current) drug therapy: Secondary | ICD-10-CM | POA: Insufficient documentation

## 2021-03-08 DIAGNOSIS — K219 Gastro-esophageal reflux disease without esophagitis: Secondary | ICD-10-CM | POA: Diagnosis not present

## 2021-03-08 DIAGNOSIS — D649 Anemia, unspecified: Secondary | ICD-10-CM

## 2021-03-08 DIAGNOSIS — Z3A25 25 weeks gestation of pregnancy: Secondary | ICD-10-CM | POA: Insufficient documentation

## 2021-03-08 DIAGNOSIS — I10 Essential (primary) hypertension: Secondary | ICD-10-CM | POA: Insufficient documentation

## 2021-03-08 DIAGNOSIS — D561 Beta thalassemia: Secondary | ICD-10-CM | POA: Insufficient documentation

## 2021-03-08 DIAGNOSIS — O99013 Anemia complicating pregnancy, third trimester: Secondary | ICD-10-CM | POA: Diagnosis not present

## 2021-03-08 DIAGNOSIS — D509 Iron deficiency anemia, unspecified: Secondary | ICD-10-CM | POA: Insufficient documentation

## 2021-03-08 MED ORDER — SODIUM CHLORIDE 0.9 % IV SOLN
Freq: Once | INTRAVENOUS | Status: AC
  Filled 2021-03-08: qty 250

## 2021-03-08 MED ORDER — SODIUM CHLORIDE 0.9 % IV SOLN: 200.0000 mg | Freq: Once | INTRAVENOUS | Status: DC

## 2021-03-08 MED ORDER — IRON SUCROSE 20 MG/ML IV SOLN
200.0000 mg | Freq: Once | INTRAVENOUS | Status: AC
  Administered 2021-03-08: 200 mg via INTRAVENOUS
  Filled 2021-03-08: qty 10

## 2021-03-08 NOTE — Patient Instructions (Signed)
[facility]  Discharge Instructions: Thank you for choosing [facility] to provide your oncology and hematology care.  If you have a lab appointment with the Cancer Center, please go directly to the Cancer Center and check in at the registration area.  Wear comfortable clothing and clothing appropriate for easy access to any Portacath or PICC line.   We strive to give you quality time with your provider. You may need to reschedule your appointment if you arrive late (15 or more minutes).  Arriving late affects you and other patients whose appointments are after yours.  Also, if you miss three or more appointments without notifying the office, you may be dismissed from the clinic at the provider's discretion.      For prescription refill requests, have your pharmacy contact our office and allow 72 hours for refills to be completed.    Today you received the following : venofer   To help prevent nausea and vomiting after your treatment, we encourage you to take your nausea medication as directed.  BELOW ARE SYMPTOMS THAT SHOULD BE REPORTED IMMEDIATELY: *FEVER GREATER THAN 100.4 F (38 C) OR HIGHER *CHILLS OR SWEATING *NAUSEA AND VOMITING THAT IS NOT CONTROLLED WITH YOUR NAUSEA MEDICATION *UNUSUAL SHORTNESS OF BREATH *UNUSUAL BRUISING OR BLEEDING *URINARY PROBLEMS (pain or burning when urinating, or frequent urination) *BOWEL PROBLEMS (unusual diarrhea, constipation, pain near the anus) TENDERNESS IN MOUTH AND THROAT WITH OR WITHOUT PRESENCE OF ULCERS (sore throat, sores in mouth, or a toothache) UNUSUAL RASH, SWELLING OR PAIN  UNUSUAL VAGINAL DISCHARGE OR ITCHING   Items with * indicate a potential emergency and should be followed up as soon as possible or go to the Emergency Department if any problems should occur.  Please show the CHEMOTHERAPY ALERT CARD or IMMUNOTHERAPY ALERT CARD at check-in to the Emergency Department and triage  nurse.  Should you have questions after your visit or need to cancel or reschedule your appointment, please contact [facility]  [phone] and follow the prompts.  Office hours are 8:00 a.m. to 4:30 p.m. Monday - Friday. Please note that voicemails left after 4:00 p.m. may not be returned until the following business day.  We are closed weekends and major holidays. You have access to a nurse at all times for urgent questions. Please call the main number to the clinic [phone] and follow the prompts.  For any non-urgent questions, you may also contact your provider using MyChart. We now offer e-Visits for anyone 18 and older to request care online for non-urgent symptoms. For details visit [email or web address].   Also download the MyChart app! Go to the app store, search "MyChart", open the app, select [facility], and log in with your MyChart username and password.  Due to Covid, a mask is required upon entering the hospital/clinic. If you do not have a mask, one will be given to you upon arrival. For doctor visits, patients may have 1 support person aged 18 or older with them. For treatment visits, patients cannot have anyone with them due to current Covid guidelines and our immunocompromised population.  

## 2021-03-13 DIAGNOSIS — O10919 Unspecified pre-existing hypertension complicating pregnancy, unspecified trimester: Secondary | ICD-10-CM | POA: Insufficient documentation

## 2021-03-15 ENCOUNTER — Encounter: Payer: Self-pay | Admitting: Nurse Practitioner

## 2021-03-15 ENCOUNTER — Inpatient Hospital Stay: Payer: Medicaid Other

## 2021-03-15 ENCOUNTER — Inpatient Hospital Stay (HOSPITAL_BASED_OUTPATIENT_CLINIC_OR_DEPARTMENT_OTHER): Payer: Medicaid Other | Admitting: Nurse Practitioner

## 2021-03-15 ENCOUNTER — Other Ambulatory Visit: Payer: Self-pay

## 2021-03-15 VITALS — BP 109/98 | HR 83 | Temp 97.8°F | Resp 20 | Wt 140.2 lb

## 2021-03-15 DIAGNOSIS — Z3A25 25 weeks gestation of pregnancy: Secondary | ICD-10-CM | POA: Diagnosis not present

## 2021-03-15 DIAGNOSIS — D509 Iron deficiency anemia, unspecified: Secondary | ICD-10-CM

## 2021-03-15 DIAGNOSIS — O99019 Anemia complicating pregnancy, unspecified trimester: Secondary | ICD-10-CM | POA: Diagnosis not present

## 2021-03-15 DIAGNOSIS — D649 Anemia, unspecified: Secondary | ICD-10-CM

## 2021-03-15 DIAGNOSIS — I10 Essential (primary) hypertension: Secondary | ICD-10-CM | POA: Diagnosis not present

## 2021-03-15 DIAGNOSIS — K219 Gastro-esophageal reflux disease without esophagitis: Secondary | ICD-10-CM | POA: Diagnosis not present

## 2021-03-15 DIAGNOSIS — Z79899 Other long term (current) drug therapy: Secondary | ICD-10-CM | POA: Diagnosis not present

## 2021-03-15 DIAGNOSIS — D561 Beta thalassemia: Secondary | ICD-10-CM

## 2021-03-15 DIAGNOSIS — O99013 Anemia complicating pregnancy, third trimester: Secondary | ICD-10-CM | POA: Diagnosis not present

## 2021-03-15 LAB — CBC WITH DIFFERENTIAL/PLATELET
Abs Immature Granulocytes: 0.12 10*3/uL — ABNORMAL HIGH (ref 0.00–0.07)
Basophils Absolute: 0 10*3/uL (ref 0.0–0.1)
Basophils Relative: 1 %
Eosinophils Absolute: 0.3 10*3/uL (ref 0.0–0.5)
Eosinophils Relative: 4 %
HCT: 28.6 % — ABNORMAL LOW (ref 36.0–46.0)
Hemoglobin: 9 g/dL — ABNORMAL LOW (ref 12.0–15.0)
Immature Granulocytes: 2 %
Lymphocytes Relative: 30 %
Lymphs Abs: 2.4 10*3/uL (ref 0.7–4.0)
MCH: 20 pg — ABNORMAL LOW (ref 26.0–34.0)
MCHC: 31.5 g/dL (ref 30.0–36.0)
MCV: 63.6 fL — ABNORMAL LOW (ref 80.0–100.0)
Monocytes Absolute: 0.6 10*3/uL (ref 0.1–1.0)
Monocytes Relative: 7 %
Neutro Abs: 4.5 10*3/uL (ref 1.7–7.7)
Neutrophils Relative %: 56 %
Platelets: 191 10*3/uL (ref 150–400)
RBC: 4.5 MIL/uL (ref 3.87–5.11)
RDW: 16.8 % — ABNORMAL HIGH (ref 11.5–15.5)
WBC: 7.9 10*3/uL (ref 4.0–10.5)
nRBC: 0 % (ref 0.0–0.2)

## 2021-03-15 LAB — FERRITIN: Ferritin: 222 ng/mL (ref 11–307)

## 2021-03-15 LAB — IRON AND TIBC
Iron: 92 ug/dL (ref 28–170)
Saturation Ratios: 23 % (ref 10.4–31.8)
TIBC: 403 ug/dL (ref 250–450)
UIBC: 311 ug/dL

## 2021-03-15 LAB — RETIC PANEL
Immature Retic Fract: 28.6 % — ABNORMAL HIGH (ref 2.3–15.9)
RBC.: 4.43 MIL/uL (ref 3.87–5.11)
Retic Count, Absolute: 147.1 10*3/uL (ref 19.0–186.0)
Retic Ct Pct: 3.3 % — ABNORMAL HIGH (ref 0.4–3.1)
Reticulocyte Hemoglobin: 22.3 pg — ABNORMAL LOW (ref >27.9)

## 2021-03-15 NOTE — Progress Notes (Signed)
Patient has no concerns at the moment. 

## 2021-03-15 NOTE — Progress Notes (Signed)
[facility] at [facility] [address], [address] [place], [place] [address] [phone] (phone) [phone] (fax)  Clinic Day:  03/15/2021  Referring physician: Alinda Dooms, NP  CC: Anemia  HISTORY OF PRESENTING ILLNESS:  Candice Walker is a 35 y.o. female with a history of anemia, hypertension, beta thalassemia trait, gerd, who is referred in consultation with Heloise Ochoa, CNM for assessment and management of anemia. Patient is a refugee from [place] and curerntly living locally and has a local host family, who accompanies her today and contributes to history. Patient is currently [redacted] weeks pregnant with estimated delivery date of 05/17/21. She is [id number]. Has been anemic in previous pregnancies and out of pregnancy. She has been seen at [facility] in high risk pregnancy clinic. She has a history of heavy menstrual periods as well. Per patient she has a history of thalassemia. She has previously taken oral iron but was unable to tolerate due to Gi side effects. Endorses restless leg & cramping. Endorses previous pica: ice and dirt. No melena or hematochezia. No dizziness or falls. No recent fevers or illness. No easy bleeding or bruising. Appetite is good and she denies weight loss. Denies chest pain. Shortness of breath with exertion. Denies nausea, vomiting, constipation, or diarrhea. Denies urinary complaints. Denies other specific complaints today.   INTERVAL HISTORY: Patient returns to clinic for labs, further evaluation, and consideration of IV iron. She feels better since receiving iron. She is currently [redacted] weeks pregnant. No complications. No bleeding. No pica or restless leg. Fatigue has improved. Shortness of breath has nearly resolved though persists with exertion. Activity is becoming more difficult as her belly grows.   Review of Systems  Constitutional:  Negative for appetite change, fatigue and unexpected weight change.  HENT:   Negative for mouth  sores, sore throat and trouble swallowing.   Respiratory:  Positive for shortness of breath. Negative for chest tightness and cough.   Cardiovascular:  Negative for leg swelling and palpitations.  Gastrointestinal:  Negative for abdominal pain, constipation, diarrhea, nausea and vomiting.  Genitourinary:  Negative for bladder incontinence, dysuria, hematuria, menstrual problem, pelvic pain, vaginal bleeding and vaginal discharge.   Musculoskeletal:  Negative for flank pain and neck stiffness.  Skin:  Negative for itching, rash and wound.  Neurological:  Negative for dizziness, extremity weakness, headaches, light-headedness and numbness.  Hematological:  Negative for adenopathy. Does not bruise/bleed easily.  Psychiatric/Behavioral:  Negative for confusion, depression and sleep disturbance. The patient is not nervous/anxious.     Past Medical History:  Diagnosis Date   Anemia    Beta thalassemia (HCC)    GERD (gastroesophageal reflux disease)    Gestational diabetes    Hypertension    History reviewed. No pertinent surgical history.  Family History  Problem Relation Age of Onset   Hypertension Mother    Diabetes Father    Diabetes Paternal Grandmother    OB History     Gravida  8   Para  6   Term      Preterm      AB      Living  6      SAB      IAB      Ectopic      Multiple      Live Births             Current Outpatient Medications on File Prior to Visit  Medication Sig Dispense Refill   ACCU-CHEK GUIDE test strip 1  EACH (1 STRIP TOTAL) 4 (FOUR) TIMES DAILY USE AS INSTRUCTED.     Accu-Chek Softclix Lancets lancets SMARTSIG:1 Each Topical 4 Times Daily     aspirin 81 MG EC tablet Take 1 tablet by mouth.     Blood Pressure Monitor MISC To monitor blood pressure at home during pregnancy.     labetalol (NORMODYNE) 100 MG tablet Take 100 mg by mouth 2 (two) times daily.     omeprazole (PRILOSEC) 40 MG capsule Take 1 capsule by mouth daily.     Prenatal  27-1 MG TABS Take 1 tablet by mouth daily.     calcium carbonate (TUMS EX) 750 MG chewable tablet Chew by mouth. (Patient not taking: No sig reported)     famotidine (PEPCID) 20 MG tablet Take 1 tablet by mouth 2 (two) times daily. (Patient not taking: No sig reported)     fluticasone (FLONASE) 50 MCG/ACT nasal spray Place into the nose. (Patient not taking: No sig reported)     folic acid (FOLVITE) 1 MG tablet Take by mouth. (Patient not taking: No sig reported)     No current facility-administered medications on file prior to visit.   No Known Allergies  Social History   Socioeconomic History   Marital status: Married    Spouse name: Yama   Number of children: 6   Years of education: Not on file   Highest education level: Not on file  Occupational History   Not on file  Tobacco Use   Smoking status: Never    Passive exposure: Never   Smokeless tobacco: Never  Vaping Use   Vaping Use: Never used  Substance and Sexual Activity   Alcohol use: Never   Drug use: Never   Sexual activity: Yes    Birth control/protection: None  Other Topics Concern   Not on file  Social History Narrative   Patient previously lived in [place] and left as refugee. Resided in refugee camp in [place], then in [place] and now lives in [place] with a host family along with her husband and children.    Social Determinants of Health   Financial Resource Strain: Not on file  Food Insecurity: Not on file  Transportation Needs: Not on file  Physical Activity: Not on file  Stress: Not on file  Social Connections: Not on file    OBJECTIVE: Blood pressure (!) 109/98, pulse 83, temperature 97.8 F (36.6 C), resp. rate 20, weight 140 lb 3.2 oz (63.6 kg), SpO2 100 %.  Wt Readings from Last 3 Encounters:  03/15/21 140 lb 3.2 oz (63.6 kg)  03/06/21 140 lb 3.2 oz (63.6 kg)  03/01/21 142 lb (64.4 kg)    Body mass index is 26.49 kg/m.  Performance status (ECOG): 1 - Symptomatic but completely  ambulatory  Physical Exam Nursing note reviewed.  Constitutional:      Appearance: She is not ill-appearing.     Comments: Accompanied by her sponsor and interpreter  HENT:     Head: Normocephalic.  Eyes:     General: No scleral icterus.    Conjunctiva/sclera: Conjunctivae normal.  Cardiovascular:     Rate and Rhythm: Normal rate and regular rhythm.     Pulses: Normal pulses.  Pulmonary:     Effort: Pulmonary effort is normal. No respiratory distress.     Breath sounds: Normal breath sounds.  Abdominal:     General: There is no distension.     Tenderness: There is no abdominal tenderness. There is no guarding.  Comments: gravid  Musculoskeletal:        General: No swelling or deformity.     Cervical back: Neck supple.  Lymphadenopathy:     Cervical: No cervical adenopathy.  Skin:    General: Skin is warm and dry.     Coloration: Skin is not pale.     Findings: No rash.  Neurological:     Mental Status: She is alert and oriented to person, place, and time.  Psychiatric:        Behavior: Behavior normal.        Thought Content: Thought content normal.        Judgment: Judgment normal.    LABS:   CBC Latest Ref Rng & Units 03/15/2021 02/01/2021  WBC 4.0 - 10.5 K/uL 7.9 7.6  Hemoglobin 12.0 - 15.0 g/dL 9.0(L) 8.8(L)  Hematocrit 36.0 - 46.0 % 28.6(L) 27.8(L)  Platelets 150 - 400 K/uL 191 190   CMP Latest Ref Rng & Units 02/01/2021  Glucose 70 - 99 mg/dL 170(Y)  BUN 6 - 20 mg/dL 11  Creatinine 1.74 - 9.44 mg/dL 9.67  Sodium 591 - 638 mmol/L 131(L)  Potassium 3.5 - 5.1 mmol/L 3.3(L)  Chloride 98 - 111 mmol/L 105  CO2 22 - 32 mmol/L 20(L)  Calcium 8.9 - 10.3 mg/dL 4.6(K)  Total Protein 6.5 - 8.1 g/dL 6.8  Total Bilirubin 0.3 - 1.2 mg/dL 0.6  Alkaline Phos 38 - 126 U/L 57  AST 15 - 41 U/L 17  ALT 0 - 44 U/L 10   No results found for: CEA1 / No results found for: CEA1 No results found for: PSA1 No results found for: ZLD357 No results found for: SVX793  Lab  Results  Component Value Date   TOTALPROTELP 6.1 02/01/2021   ALBUMINELP 3.3 02/01/2021   A1GS 0.3 02/01/2021   A2GS 0.7 02/01/2021   BETS 1.1 02/01/2021   GAMS 0.7 02/01/2021   MSPIKE Not Observed 02/01/2021   SPEI Comment 02/01/2021   Lab Results  Component Value Date   TIBC 480 (H) 02/01/2021   FERRITIN 9 (L) 02/01/2021   IRONPCTSAT 33 (H) 02/01/2021   Lab Results  Component Value Date   LDH 100 02/01/2021    STUDIES:  [place] MFM OB DETAIL +14 WK  Result Date: 03/01/2021 ----------------------------------------------------------------------  OBSTETRICS REPORT                       (Signed Final 03/01/2021 05:34 pm) ---------------------------------------------------------------------- Patient Info  ID #:       [id number]                          D.O.B.:  April 28, 1986 (35 yrs)  Name:       Candice Walker                  Visit Date: 03/01/2021 03:51 pm ---------------------------------------------------------------------- Performed By  Attending:        Noralee Space MD        Ref. Address:     [address] [place],  [place] [address]  Performed By:     Lorn Junes,           [facility]         [facility]                    RDMS, RDCS                               [facility]etal Care at                                                             [facility]  Referred By:      [facility]                    MCVEY ---------------------------------------------------------------------- Orders  #  Description                           Code        Ordered By  1  [place] MFM OB DETAIL +14 WK               76811.01    RAVI [facility] ----------------------------------------------------------------------  #  Order #                     Accession #                Episode #  1  [id number]                   [id number]                 [id number]  ---------------------------------------------------------------------- Indications  Pre-existing essential hypertension            O10.013  complicating pregnancy, third trimester  Gestational diabetes in pregnancy,             O24.419  unspecified control  Anemia during pregnancy in third trimester     O82.013  Advanced maternal age multigravida 109+,        O65.523  third trimester  Grand multiparity, antepartum                  O09.40  [redacted] weeks gestation of pregnancy                Z3A.29 ---------------------------------------------------------------------- Fetal Evaluation  Num Of Fetuses:         1  Fetal Heart Rate(bpm):  153  Cardiac Activity:       Observed  Presentation:           Transverse  Placenta:               Anterior  P. Cord Insertion:      Visualized, central  Amniotic Fluid  AFI FV:      Within normal limits  AFI Sum(cm)     %Tile       Largest Pocket(cm)  15.2            54          5.7  RUQ(cm)       RLQ(cm)       LUQ(cm)  LLQ(cm)  3.9           2.7           3              5.6 ---------------------------------------------------------------------- Biometry  BPD:      75.1  mm     G. Age:  30w 1d         73  %    CI:         74.3   %    70 - 86                                                          FL/HC:      18.8   %    19.6 - 20.8  HC:      276.6  mm     G. Age:  30w 2d         55  %    HC/AC:      1.12        0.99 - 1.21  AC:       248   mm     G. Age:  29w 0d         44  %    FL/BPD:     69.2   %    71 - 87  FL:         52  mm     G. Age:  27w 5d          8  %    FL/AC:      21.0   %    20 - 24  HUM:      47.9  mm     G. Age:  28w 0d         26  %  CER:      35.1  mm     G. Age:  29w 3d         69  %  LV:        4.3  mm  CM:        5.8  mm  Est. FW:    1285  gm    2 lb 13 oz      30  % ---------------------------------------------------------------------- OB History  Gravidity:    7         Term:   6  Living:       6  ---------------------------------------------------------------------- Gestational Age  LMP:           27w 1d        Date:  08/23/20                 EDD:   05/30/21  U/S Today:     29w 2d                                        EDD:   05/15/21  Best:          29w 0d     Det. By:  Previous Ultrasound      EDD:   05/17/21                                      (  01/05/21) ---------------------------------------------------------------------- Anatomy  Cranium:               Appears normal         Aortic Arch:            Previously seen  Cavum:                 Appears normal         Ductal Arch:            Previously seen  Ventricles:            Appears normal         Diaphragm:              Previously seen  Choroid Plexus:        Appears normal         Stomach:                Previously Seen  Cerebellum:            Appears normal         Abdomen:                Previously seen  Posterior Fossa:       Appears normal         Abdominal Wall:         Appears nml (cord                                                                        insert, abd wall)  Nuchal Fold:           Not applicable (>20    Cord Vessels:           Appears normal ([redacted]                         wks GA)                                        vessel cord)  Face:                  Appears normal         Kidneys:                Appear normal                         (orbits and profile)  Lips:                  Appears normal         Bladder:                Appears normal  Heart:                 Appears normal         Spine:                  Not well visualized                         (  4CH, axis, and                         situs)  RVOT:                  Previously seen        Upper Extremities:      Not well visualized  LVOT:                  Previously seen        Lower Extremities:      Not well visualized  Other:  Fetus appears to be a female. ---------------------------------------------------------------------- Cervix Uterus Adnexa  Cervix  Length:            4.44  cm.  Normal appearance by transabdominal scan.  Right Ovary  Within normal limits.  Left Ovary  Not visualized. ---------------------------------------------------------------------- Impression  On today's ultrasound, fetal growth is appropriate for  gestational age.  Amniotic fluid is normal and good fetal  activity seen.  No obvious fetal structural defects are seen.  Fetal anatomical survey appears normal but limited by  advanced gestational age.  xxxxxxxxxxxxxxxxxxxxxxxxxxxxxxxxxxxxxxxxxxxxxxxxxxxxxxxxx  xxxxxxxxxxx  Consultation (see EPIC )  I had the pleasure of seeing Ms. Girardot today at [facility].  She is G7 P6 at 28-weeks' gestation and  is here for ultrasound evaluation of her pregnancy.  I obtained  history and counseled her with help of phone language  interpreter.  Patient does not want a female physician to  perform ultrasound.  Her problems include:  -Chronic hypertension: Patient takes labetalol 50 mg twice  daily. Blood pressure today at our office is 113/72 mm Hg.  She does not take low-dose aspirin prophylaxis.  - Gestational diabetes. She had abnormal 1-hour glucose  challenge screening (222 mg/dL).  Patient has an  appointment with the diabetes clinic and will be learning  blood glucose monitoring.  -Advanced maternal age: On cell free fetal DNA screening,  she had low risk for fetal aneuploidies.  -Anemia: Significantly iron-deficient with other possible  causes. Patient had hematology consultation (see 02/01/21  note in EPIC). She had iron infusion today. Most-recent  hemoglobin was 8.8%.  -Beta thalassemia trait.  Obstetrical history significant for 6 term vaginal deliveries.  Patient had gestational diabetes and 3 of her pregnancies.  Prenatal course: MSAFP screening showed low risk for open  neural tube defects.  Her pregnancy is dated by early  ultrasound (21 weeks) and her EDD is 05/17/2021.  Our concerns include  Chronic hypertension  I counseled her on the possible  adverse effects of  uncontrolled hypertension (maternal).  Chronic hypertension  can also be associated with fetal growth restriction, placental  abruption and superimposed preeclampsia (30%).  I explained our ultrasound protocol of weekly BPP from [redacted]  weeks gestation till delivery provider her blood pressures are  well controlled, she can be delivered at [redacted] weeks gestation.  Early term delivery (37 to [redacted] weeks gestation) should be  considered if hypertension is not well controlled.  Gestational diabetes  I explained the importance of good blood glucose control to  prevent adverse fetal or neonatal outcomes.  I encouraged  her to keep the appointment and check her blood glucose  regularly.  I discussed the importance of diet, exercise and or  oral hypoglycemics/insulin for control of diabetes.  I explained  that in most cases diabetes resolves after delivery.  However,  in about 25% of  the cases, it can lead to type 2 diabetes and  that we recommend screening 6 weeks after delivery.  Advanced maternal age.  I briefly explained the significance of cell free fetal DNA  screening that showed low risk for Down syndrome.  Anemia  Discussed the importance of iron supplements (iron  transfusions) to improve hemoglobin.  Patient is a grand  multigravida and can have severe postpartum hemorrhage.  Iron supplements by increasing hemoglobin can avoid blood  transfusions. ---------------------------------------------------------------------- Recommendations  -An appointment was made for her to return in 3 weeks for  BPP and then weekly BPP till delivery.  -Fetal growth assessment in 4 weeks.  -Regular prenatal visits. ----------------------------------------------------------------------                  Noralee Space, MD Electronically Signed Final Report   03/01/2021 05:34 pm ----------------------------------------------------------------------    ASSESSMENT & PLAN:   Assessment:  Candice Walker is a 35 y.o. female with  anemia of pregnancy who presents for new patient evaluation.   Iron Deficiency anemia- related to pregnancy- s/p venofer x 5. Hemoglobin improved to 9.0. persistent microcytosis. Ferritin pending at time of visit but has improved to 222 with normal iron saturation and TIBC. Would not recommend additional iron at this point. Plan to recheck counts following delivery. She may require transfusions based on blood loss  during delivery and says she prefers directed blood donation from her husband if at all possible. We discussed logistics and she will discuss with her husband. If she requires frequent transfusions she is at risk of iron overload and will need chelation. Monitor.   2. Beta thalassemia - HbA2 is increased, HbA is decreased. HbF is normal. Findings consistent with beta thalassemia.We discussed etiology and implications of beta thalassemia today in detail.  Encouraged having her children checked for anemia as well as thalassemia is genetic condition. RDW is elevated but ferritin has normalized to 222. These findings demonstrate anemia is likely secondary to thalassemia at this point as opposed to IDA. She will benefit from continued oral supplementation of 1 mg of folic acid post pregnancy as this may help to increase erythropoiesis.   3. Heavy menses- patient plans to undergo BTL post delivery as she does not desire future fertility. She does struggle with heavy menses however and we discussed that she may need ongoing monitoring of her blood counts with heavy periods.   Plan: Follow up 6 weeks after delivery for recheck of labs. Due to religious and cultural preferences, patient prefers all female providers.   Due to a language barrier, an interpreter was utilized for all patient interactions.   I discussed the assessment and treatment plan with the patient.  The patient was provided an opportunity to ask questions and all were answered.  The patient agreed with the plan and demonstrated an  understanding of the instructions.  The patient was advised to call back if the symptoms worsen or if the condition fails to improve as anticipated.  I discussed the assessment and treatment plan with the patient. The patient was provided an opportunity to ask questions and all were answered. The patient agreed with the plan and demonstrated an understanding of the instructions.   The patient was advised to call back or seek an in-person evaluation if the symptoms worsen or if the condition fails to improve as anticipated.   I spent 45 minutes face-to-face visit time dedicated to the care of this patient on the date of this encounter to including pre-visit  review of hematology notes, gyn notes, labs, face-to-face time with the patient, and post visit ordering of testing/documentation. I also reviewed labs and plan of care with Dr. Donneta RombergBrahmanday who contributed to plan of care.    Thank you for the opportunity to participate in the care of this very pleasant patient.     Consuello MasseLauren Edwena Mayorga, DNP, AGNP-C [facility]

## 2021-03-16 ENCOUNTER — Encounter: Payer: Self-pay | Admitting: Nurse Practitioner

## 2021-03-16 DIAGNOSIS — D561 Beta thalassemia: Secondary | ICD-10-CM | POA: Insufficient documentation

## 2021-03-20 ENCOUNTER — Other Ambulatory Visit: Payer: Self-pay | Admitting: Obstetrics and Gynecology

## 2021-03-20 DIAGNOSIS — O24419 Gestational diabetes mellitus in pregnancy, unspecified control: Secondary | ICD-10-CM

## 2021-03-20 DIAGNOSIS — O09523 Supervision of elderly multigravida, third trimester: Secondary | ICD-10-CM

## 2021-03-20 DIAGNOSIS — O10919 Unspecified pre-existing hypertension complicating pregnancy, unspecified trimester: Secondary | ICD-10-CM

## 2021-03-20 DIAGNOSIS — O0943 Supervision of pregnancy with grand multiparity, third trimester: Secondary | ICD-10-CM

## 2021-03-22 ENCOUNTER — Other Ambulatory Visit: Payer: Self-pay

## 2021-03-22 ENCOUNTER — Ambulatory Visit: Payer: Medicaid Other | Attending: Maternal & Fetal Medicine

## 2021-03-22 DIAGNOSIS — O10919 Unspecified pre-existing hypertension complicating pregnancy, unspecified trimester: Secondary | ICD-10-CM

## 2021-03-22 DIAGNOSIS — O09523 Supervision of elderly multigravida, third trimester: Secondary | ICD-10-CM | POA: Insufficient documentation

## 2021-03-22 DIAGNOSIS — Z3A32 32 weeks gestation of pregnancy: Secondary | ICD-10-CM | POA: Diagnosis not present

## 2021-03-22 DIAGNOSIS — O24419 Gestational diabetes mellitus in pregnancy, unspecified control: Secondary | ICD-10-CM | POA: Insufficient documentation

## 2021-03-22 DIAGNOSIS — O0943 Supervision of pregnancy with grand multiparity, third trimester: Secondary | ICD-10-CM

## 2021-03-22 DIAGNOSIS — O10913 Unspecified pre-existing hypertension complicating pregnancy, third trimester: Secondary | ICD-10-CM | POA: Diagnosis not present

## 2021-03-26 ENCOUNTER — Other Ambulatory Visit: Payer: Self-pay | Admitting: Maternal & Fetal Medicine

## 2021-03-26 DIAGNOSIS — O10919 Unspecified pre-existing hypertension complicating pregnancy, unspecified trimester: Secondary | ICD-10-CM

## 2021-03-26 DIAGNOSIS — O09523 Supervision of elderly multigravida, third trimester: Secondary | ICD-10-CM

## 2021-03-26 DIAGNOSIS — O99019 Anemia complicating pregnancy, unspecified trimester: Secondary | ICD-10-CM

## 2021-03-26 DIAGNOSIS — O24419 Gestational diabetes mellitus in pregnancy, unspecified control: Secondary | ICD-10-CM

## 2021-03-26 DIAGNOSIS — D561 Beta thalassemia: Secondary | ICD-10-CM | POA: Diagnosis not present

## 2021-03-26 DIAGNOSIS — O10913 Unspecified pre-existing hypertension complicating pregnancy, third trimester: Secondary | ICD-10-CM | POA: Diagnosis not present

## 2021-03-26 DIAGNOSIS — O99012 Anemia complicating pregnancy, second trimester: Secondary | ICD-10-CM | POA: Diagnosis not present

## 2021-03-26 DIAGNOSIS — O163 Unspecified maternal hypertension, third trimester: Secondary | ICD-10-CM | POA: Diagnosis not present

## 2021-04-03 ENCOUNTER — Ambulatory Visit: Payer: Medicaid Other | Attending: Obstetrics and Gynecology

## 2021-04-03 ENCOUNTER — Other Ambulatory Visit: Payer: Self-pay

## 2021-04-03 DIAGNOSIS — O10919 Unspecified pre-existing hypertension complicating pregnancy, unspecified trimester: Secondary | ICD-10-CM

## 2021-04-03 DIAGNOSIS — D649 Anemia, unspecified: Secondary | ICD-10-CM

## 2021-04-03 DIAGNOSIS — O09523 Supervision of elderly multigravida, third trimester: Secondary | ICD-10-CM | POA: Insufficient documentation

## 2021-04-03 DIAGNOSIS — O99013 Anemia complicating pregnancy, third trimester: Secondary | ICD-10-CM | POA: Diagnosis not present

## 2021-04-03 DIAGNOSIS — O99019 Anemia complicating pregnancy, unspecified trimester: Secondary | ICD-10-CM

## 2021-04-03 DIAGNOSIS — Z3A33 33 weeks gestation of pregnancy: Secondary | ICD-10-CM | POA: Diagnosis not present

## 2021-04-03 DIAGNOSIS — O0943 Supervision of pregnancy with grand multiparity, third trimester: Secondary | ICD-10-CM | POA: Insufficient documentation

## 2021-04-03 DIAGNOSIS — O24419 Gestational diabetes mellitus in pregnancy, unspecified control: Secondary | ICD-10-CM | POA: Insufficient documentation

## 2021-04-03 DIAGNOSIS — O10013 Pre-existing essential hypertension complicating pregnancy, third trimester: Secondary | ICD-10-CM | POA: Insufficient documentation

## 2021-04-05 DIAGNOSIS — Z419 Encounter for procedure for purposes other than remedying health state, unspecified: Secondary | ICD-10-CM | POA: Diagnosis not present

## 2021-04-10 ENCOUNTER — Ambulatory Visit: Payer: Medicaid Other | Attending: Maternal & Fetal Medicine | Admitting: Maternal & Fetal Medicine

## 2021-04-10 ENCOUNTER — Other Ambulatory Visit: Payer: Medicaid Other

## 2021-04-10 ENCOUNTER — Other Ambulatory Visit: Payer: Self-pay

## 2021-04-10 VITALS — BP 122/71 | HR 80 | Temp 97.8°F | Ht 60.0 in | Wt 141.0 lb

## 2021-04-10 DIAGNOSIS — O10012 Pre-existing essential hypertension complicating pregnancy, second trimester: Secondary | ICD-10-CM | POA: Insufficient documentation

## 2021-04-10 DIAGNOSIS — O10013 Pre-existing essential hypertension complicating pregnancy, third trimester: Secondary | ICD-10-CM

## 2021-04-10 DIAGNOSIS — Z3A34 34 weeks gestation of pregnancy: Secondary | ICD-10-CM | POA: Insufficient documentation

## 2021-04-10 NOTE — Procedures (Signed)
Candice Walker 1986-02-05 [redacted]w[redacted]d  Fetus A Non-Stress Test Interpretation for 04/10/21  Indication: Chronic Hypertenstion, AMA, Gest Diabetes, Anemia, Grand Multip Started @ [phone] Completed @ 1024  Fetal Heart Rate A Mode: External Baseline Rate (A): 143 bpm Accelerations: 15 x 15 Decelerations: None Multiple birth?: No  Uterine Activity Mode: Toco  Interpretation (Fetal Testing) Nonstress Test Interpretation: Reactive (Per Dr. Grace Bushy)  I reviewed today's tracing and agree with the above note.  Corenthian Unk Lightning.

## 2021-04-13 DIAGNOSIS — D561 Beta thalassemia: Secondary | ICD-10-CM | POA: Diagnosis not present

## 2021-04-13 DIAGNOSIS — O09893 Supervision of other high risk pregnancies, third trimester: Secondary | ICD-10-CM | POA: Diagnosis not present

## 2021-04-13 DIAGNOSIS — O09523 Supervision of elderly multigravida, third trimester: Secondary | ICD-10-CM | POA: Diagnosis not present

## 2021-04-13 DIAGNOSIS — O10913 Unspecified pre-existing hypertension complicating pregnancy, third trimester: Secondary | ICD-10-CM | POA: Diagnosis not present

## 2021-04-17 ENCOUNTER — Ambulatory Visit: Payer: Medicaid Other | Attending: Maternal & Fetal Medicine | Admitting: Maternal & Fetal Medicine

## 2021-04-17 ENCOUNTER — Other Ambulatory Visit: Payer: Self-pay

## 2021-04-17 VITALS — BP 120/74 | HR 91 | Temp 98.0°F | Ht 60.0 in | Wt 141.5 lb

## 2021-04-17 DIAGNOSIS — Z3A35 35 weeks gestation of pregnancy: Secondary | ICD-10-CM

## 2021-04-17 DIAGNOSIS — O10013 Pre-existing essential hypertension complicating pregnancy, third trimester: Secondary | ICD-10-CM | POA: Diagnosis not present

## 2021-04-17 NOTE — Procedures (Signed)
Analiz Candice Walker May 18, 1986 [redacted]w[redacted]d  Fetus A Non-Stress Test Interpretation for 04/17/21 Started @ 1339 Completed @ 1435  Indication: AMA, Gest Diabetes, Hypertension, Anemia  Fetal Heart Rate A Mode: External Baseline Rate (A): 140 bpm Variability: Moderate Accelerations: 15 x 15 Decelerations: None Multiple birth?: No Uterine Activity Mode: Toco Interpretation (Fetal Testing) Nonstress Test Interpretation: Reactive Comments: Dr. Grace Bushy read NST today @ MFM Clinic @ [facility]

## 2021-04-19 ENCOUNTER — Other Ambulatory Visit: Payer: Self-pay

## 2021-04-19 DIAGNOSIS — O0943 Supervision of pregnancy with grand multiparity, third trimester: Secondary | ICD-10-CM

## 2021-04-19 DIAGNOSIS — O24419 Gestational diabetes mellitus in pregnancy, unspecified control: Secondary | ICD-10-CM

## 2021-04-19 DIAGNOSIS — O99013 Anemia complicating pregnancy, third trimester: Secondary | ICD-10-CM

## 2021-04-19 DIAGNOSIS — O10013 Pre-existing essential hypertension complicating pregnancy, third trimester: Secondary | ICD-10-CM

## 2021-04-20 ENCOUNTER — Other Ambulatory Visit: Payer: Self-pay | Admitting: Obstetrics and Gynecology

## 2021-04-20 ENCOUNTER — Observation Stay
Admission: EM | Admit: 2021-04-20 | Discharge: 2021-04-20 | Disposition: A | Discharge status: Home / Self Care | Payer: Medicaid Other | Attending: Obstetrics and Gynecology | Admitting: Obstetrics and Gynecology

## 2021-04-20 DIAGNOSIS — O0993 Supervision of high risk pregnancy, unspecified, third trimester: Secondary | ICD-10-CM | POA: Diagnosis not present

## 2021-04-20 DIAGNOSIS — O09523 Supervision of elderly multigravida, third trimester: Secondary | ICD-10-CM | POA: Diagnosis not present

## 2021-04-20 DIAGNOSIS — O368330 Maternal care for abnormalities of the fetal heart rate or rhythm, third trimester, not applicable or unspecified: Secondary | ICD-10-CM | POA: Diagnosis not present

## 2021-04-20 DIAGNOSIS — Z3A36 36 weeks gestation of pregnancy: Secondary | ICD-10-CM | POA: Insufficient documentation

## 2021-04-20 DIAGNOSIS — O288 Other abnormal findings on antenatal screening of mother: Secondary | ICD-10-CM | POA: Diagnosis present

## 2021-04-20 DIAGNOSIS — O10013 Pre-existing essential hypertension complicating pregnancy, third trimester: Secondary | ICD-10-CM | POA: Insufficient documentation

## 2021-04-20 DIAGNOSIS — O10913 Unspecified pre-existing hypertension complicating pregnancy, third trimester: Secondary | ICD-10-CM | POA: Diagnosis not present

## 2021-04-20 DIAGNOSIS — Z113 Encounter for screening for infections with a predominantly sexual mode of transmission: Secondary | ICD-10-CM | POA: Diagnosis not present

## 2021-04-20 LAB — OB RESULTS CONSOLE GC/CHLAMYDIA
Chlamydia: NEGATIVE
Gonorrhea: NEGATIVE

## 2021-04-20 LAB — OB RESULTS CONSOLE GBS: GBS: NEGATIVE

## 2021-04-20 MED ORDER — LACTATED RINGERS IV SOLN: INTRAVENOUS | Status: DC

## 2021-04-20 NOTE — Discharge Summary (Signed)
Patient ID: Candice Walker MRN: [id number] DOB/AGE: Jan 05, 1986 35 y.o.  Admit date: 04/20/2021 Discharge date: 05/01/2021  Admission Diagnoses: 35yo G8P6 at [redacted]w[redacted]d had an NST in the office for [facility] which was nonreactive.  She was sent from the office for further evaluation.  Discharge Diagnoses: RNST  Factors complicating pregnancy: 1. Schedule for IOL at 37 weeks and get A1c 2. GDM:  3. Advanced maternal Age, 35yo 4. Language barrier, suspect illiteracy; [place] refugee, speaks Pashto 5. CHTN  6. Grand multiparity, reports no PPH or pregnancy complications.  7. Anemia, Hgb 8.6 with NOB labs 8. Late to care, EDD by [place] 21.1wks  Prenatal Procedures: NST  Consults: None  Significant Diagnostic Studies:  No results found for this or any previous visit (from the past 168 hour(s)).  Treatments: none  Hospital Course:  This is a 35 y.o. G8P6 with IUP at [redacted]w[redacted]d presents for NST.  No leaking of fluid and no bleeding.  NST reactive.  She was observed, fetal heart rate monitoring remained reassuring, and she had no signs/symptoms of labor or other maternal-fetal concerns.  She was deemed stable for discharge to home with outpatient follow up.  Discharge Physical Exam:  There were no vitals taken for this visit.  General: NAD CV: RRR Pulm: CTABL, nl effort ABD: s/nd/nt, gravid DVT Evaluation: LE non-ttp, no evidence of DVT on exam.  NST: FHR baseline: 130 bpm Variability: moderate Accelerations: yes Decelerations: none Category/reactivity: reactive  TOCO: quiet SVE: deferred      Discharge Condition: Stable  Disposition: Discharge disposition: 01-Home or Self Care       Allergies as of 04/20/2021   No Known Allergies      Medication List     STOP taking these medications    calcium carbonate 750 MG chewable tablet Commonly known as: TUMS EX   famotidine 20 MG tablet Commonly known as: PEPCID   fluticasone 50 MCG/ACT nasal spray Commonly known as:  FLONASE   folic acid 1 MG tablet Commonly known as: FOLVITE         SignedHaroldine Laws, CNM 05/01/2021 5:41 PM

## 2021-04-24 ENCOUNTER — Other Ambulatory Visit: Payer: Self-pay

## 2021-04-24 ENCOUNTER — Ambulatory Visit: Payer: Medicaid Other | Attending: Maternal & Fetal Medicine

## 2021-04-24 DIAGNOSIS — O10013 Pre-existing essential hypertension complicating pregnancy, third trimester: Secondary | ICD-10-CM | POA: Insufficient documentation

## 2021-04-24 DIAGNOSIS — O09523 Supervision of elderly multigravida, third trimester: Secondary | ICD-10-CM | POA: Insufficient documentation

## 2021-04-24 DIAGNOSIS — O0943 Supervision of pregnancy with grand multiparity, third trimester: Secondary | ICD-10-CM | POA: Insufficient documentation

## 2021-04-24 DIAGNOSIS — Z3A36 36 weeks gestation of pregnancy: Secondary | ICD-10-CM | POA: Insufficient documentation

## 2021-04-24 DIAGNOSIS — O24419 Gestational diabetes mellitus in pregnancy, unspecified control: Secondary | ICD-10-CM | POA: Diagnosis not present

## 2021-04-24 DIAGNOSIS — O99013 Anemia complicating pregnancy, third trimester: Secondary | ICD-10-CM

## 2021-04-24 DIAGNOSIS — D649 Anemia, unspecified: Secondary | ICD-10-CM | POA: Diagnosis not present

## 2021-04-26 ENCOUNTER — Other Ambulatory Visit: Payer: Self-pay

## 2021-04-26 DIAGNOSIS — O10919 Unspecified pre-existing hypertension complicating pregnancy, unspecified trimester: Secondary | ICD-10-CM

## 2021-04-26 DIAGNOSIS — O24410 Gestational diabetes mellitus in pregnancy, diet controlled: Secondary | ICD-10-CM

## 2021-04-26 DIAGNOSIS — O09899 Supervision of other high risk pregnancies, unspecified trimester: Secondary | ICD-10-CM

## 2021-04-26 DIAGNOSIS — O09523 Supervision of elderly multigravida, third trimester: Secondary | ICD-10-CM

## 2021-04-26 NOTE — Addendum Note (Signed)
Addended by: Gustavo Lah on: 04/26/2021 08:06 PM   Modules accepted: Orders

## 2021-04-27 DIAGNOSIS — O09523 Supervision of elderly multigravida, third trimester: Secondary | ICD-10-CM | POA: Diagnosis not present

## 2021-04-27 DIAGNOSIS — O0993 Supervision of high risk pregnancy, unspecified, third trimester: Secondary | ICD-10-CM | POA: Diagnosis not present

## 2021-04-27 DIAGNOSIS — O99013 Anemia complicating pregnancy, third trimester: Secondary | ICD-10-CM | POA: Diagnosis not present

## 2021-04-27 DIAGNOSIS — O10913 Unspecified pre-existing hypertension complicating pregnancy, third trimester: Secondary | ICD-10-CM | POA: Diagnosis not present

## 2021-04-28 ENCOUNTER — Other Ambulatory Visit: Payer: Self-pay

## 2021-04-28 ENCOUNTER — Inpatient Hospital Stay
Admission: EM | Admit: 2021-04-28 | Discharge: 2021-04-30 | DRG: 797 | Disposition: A | Discharge status: Home / Self Care | Payer: Medicaid Other | Attending: Obstetrics | Admitting: Obstetrics

## 2021-04-28 ENCOUNTER — Encounter: Payer: Self-pay | Admitting: Obstetrics and Gynecology

## 2021-04-28 ENCOUNTER — Inpatient Hospital Stay: Payer: Medicaid Other | Admitting: Anesthesiology

## 2021-04-28 DIAGNOSIS — O24420 Gestational diabetes mellitus in childbirth, diet controlled: Principal | ICD-10-CM | POA: Diagnosis present

## 2021-04-28 DIAGNOSIS — D509 Iron deficiency anemia, unspecified: Secondary | ICD-10-CM | POA: Diagnosis present

## 2021-04-28 DIAGNOSIS — O09899 Supervision of other high risk pregnancies, unspecified trimester: Secondary | ICD-10-CM

## 2021-04-28 DIAGNOSIS — O1002 Pre-existing essential hypertension complicating childbirth: Secondary | ICD-10-CM | POA: Diagnosis not present

## 2021-04-28 DIAGNOSIS — O10919 Unspecified pre-existing hypertension complicating pregnancy, unspecified trimester: Secondary | ICD-10-CM

## 2021-04-28 DIAGNOSIS — Z20822 Contact with and (suspected) exposure to covid-19: Secondary | ICD-10-CM | POA: Diagnosis present

## 2021-04-28 DIAGNOSIS — Z3A37 37 weeks gestation of pregnancy: Secondary | ICD-10-CM | POA: Diagnosis not present

## 2021-04-28 DIAGNOSIS — O09893 Supervision of other high risk pregnancies, third trimester: Secondary | ICD-10-CM

## 2021-04-28 DIAGNOSIS — O9962 Diseases of the digestive system complicating childbirth: Secondary | ICD-10-CM | POA: Diagnosis present

## 2021-04-28 DIAGNOSIS — Z302 Encounter for sterilization: Secondary | ICD-10-CM | POA: Diagnosis not present

## 2021-04-28 DIAGNOSIS — K219 Gastro-esophageal reflux disease without esophagitis: Secondary | ICD-10-CM | POA: Diagnosis not present

## 2021-04-28 DIAGNOSIS — O9902 Anemia complicating childbirth: Secondary | ICD-10-CM | POA: Diagnosis not present

## 2021-04-28 DIAGNOSIS — O24410 Gestational diabetes mellitus in pregnancy, diet controlled: Secondary | ICD-10-CM

## 2021-04-28 LAB — PROTEIN / CREATININE RATIO, URINE
Creatinine, Urine: 159 mg/dL
Protein Creatinine Ratio: 0.11 mg/mg{Cre} (ref 0.00–0.15)
Total Protein, Urine: 17 mg/dL

## 2021-04-28 LAB — CBC
HCT: 29.3 % — ABNORMAL LOW (ref 36.0–46.0)
Hemoglobin: 9.6 g/dL — ABNORMAL LOW (ref 12.0–15.0)
MCH: 20.4 pg — ABNORMAL LOW (ref 26.0–34.0)
MCHC: 32.8 g/dL (ref 30.0–36.0)
MCV: 62.3 fL — ABNORMAL LOW (ref 80.0–100.0)
Platelets: 184 10*3/uL (ref 150–400)
RBC: 4.7 MIL/uL (ref 3.87–5.11)
RDW: 15.9 % — ABNORMAL HIGH (ref 11.5–15.5)
WBC: 7.5 10*3/uL (ref 4.0–10.5)
nRBC: 0 % (ref 0.0–0.2)

## 2021-04-28 LAB — URINALYSIS, COMPLETE (UACMP) WITH MICROSCOPIC
Bilirubin Urine: NEGATIVE
Glucose, UA: 150 mg/dL — AB
Hgb urine dipstick: NEGATIVE
Ketones, ur: NEGATIVE mg/dL
Leukocytes,Ua: NEGATIVE
Nitrite: NEGATIVE
Protein, ur: NEGATIVE mg/dL
Specific Gravity, Urine: 1.02 (ref 1.005–1.030)
pH: 6 (ref 5.0–8.0)

## 2021-04-28 LAB — RAPID HIV SCREEN (HIV 1/2 AB+AG)
HIV 1/2 Antibodies: NONREACTIVE
HIV-1 P24 Antigen - HIV24: NONREACTIVE

## 2021-04-28 LAB — COMPREHENSIVE METABOLIC PANEL
ALT: 11 U/L (ref 0–44)
AST: 19 U/L (ref 15–41)
Albumin: 3.1 g/dL — ABNORMAL LOW (ref 3.5–5.0)
Alkaline Phosphatase: 141 U/L — ABNORMAL HIGH (ref 38–126)
Anion gap: 8 (ref 5–15)
BUN: 8 mg/dL (ref 6–20)
CO2: 22 mmol/L (ref 22–32)
Calcium: 8.4 mg/dL — ABNORMAL LOW (ref 8.9–10.3)
Chloride: 106 mmol/L (ref 98–111)
Creatinine, Ser: 0.6 mg/dL (ref 0.44–1.00)
GFR, Estimated: >60 mL/min (ref >60)
Glucose, Bld: 76 mg/dL (ref 70–99)
Potassium: 3.5 mmol/L (ref 3.5–5.1)
Sodium: 136 mmol/L (ref 135–145)
Total Bilirubin: 0.8 mg/dL (ref 0.3–1.2)
Total Protein: 6.2 g/dL — ABNORMAL LOW (ref 6.5–8.1)

## 2021-04-28 LAB — TYPE AND SCREEN
ABO/RH(D): O POS
Antibody Screen: NEGATIVE

## 2021-04-28 LAB — GLUCOSE, CAPILLARY
Glucose-Capillary: 75 mg/dL (ref 70–99)
Glucose-Capillary: 78 mg/dL (ref 70–99)

## 2021-04-28 LAB — RESP PANEL BY RT-PCR (FLU A&B, COVID) ARPGX2
Influenza A by PCR: NEGATIVE
Influenza B by PCR: NEGATIVE
SARS Coronavirus 2 by RT PCR: NEGATIVE

## 2021-04-28 MED ORDER — PHENYLEPHRINE 40 MCG/ML (10ML) SYRINGE FOR IV PUSH (FOR BLOOD PRESSURE SUPPORT)
80.0000 ug | PREFILLED_SYRINGE | INTRAVENOUS | Status: DC | PRN
Start: 2021-04-28 — End: 2021-04-29

## 2021-04-28 MED ORDER — BUPIVACAINE HCL (PF) 0.25 % IJ SOLN
INTRAMUSCULAR | Status: DC | PRN
  Administered 2021-04-28 (×2): 6 mL via EPIDURAL

## 2021-04-28 MED ORDER — MISOPROSTOL 200 MCG PO TABS
ORAL_TABLET | ORAL | Status: AC
  Administered 2021-04-29: 800 ug via RECTAL
  Filled 2021-04-28: qty 5

## 2021-04-28 MED ORDER — MISOPROSTOL 200 MCG PO TABS
ORAL_TABLET | ORAL | Status: AC
  Administered 2021-04-28: 25 ug via BUCCAL
  Filled 2021-04-28: qty 4

## 2021-04-28 MED ORDER — MISOPROSTOL 25 MCG QUARTER TABLET
25.0000 ug | ORAL_TABLET | ORAL | Status: DC | PRN
  Administered 2021-04-28: 25 ug via VAGINAL
  Filled 2021-04-28: qty 1

## 2021-04-28 MED ORDER — SODIUM CHLORIDE (PF) 0.9 % IJ SOLN
INTRAMUSCULAR | Status: AC
  Filled 2021-04-28: qty 50

## 2021-04-28 MED ORDER — LIDOCAINE HCL (PF) 1 % IJ SOLN: 30.0000 mL | INTRAMUSCULAR | Status: DC | PRN

## 2021-04-28 MED ORDER — LABETALOL HCL 5 MG/ML IV SOLN: 40.0000 mg | INTRAVENOUS | Status: DC | PRN

## 2021-04-28 MED ORDER — LACTATED RINGERS IV SOLN: INTRAVENOUS | Status: DC

## 2021-04-28 MED ORDER — LIDOCAINE HCL (PF) 1 % IJ SOLN
INTRAMUSCULAR | Status: AC
  Filled 2021-04-28: qty 30

## 2021-04-28 MED ORDER — LABETALOL HCL 5 MG/ML IV SOLN: 80.0000 mg | INTRAVENOUS | Status: DC | PRN

## 2021-04-28 MED ORDER — DIPHENHYDRAMINE HCL 50 MG/ML IJ SOLN: 12.5000 mg | INTRAMUSCULAR | Status: DC | PRN

## 2021-04-28 MED ORDER — FENTANYL CITRATE (PF) 100 MCG/2ML IJ SOLN
50.0000 ug | INTRAMUSCULAR | Status: DC | PRN
  Administered 2021-04-28 (×2): 50 ug via INTRAVENOUS
  Administered 2021-04-28: 100 ug via INTRAVENOUS
  Administered 2021-04-28: 50 ug via INTRAVENOUS
  Filled 2021-04-28 (×4): qty 2

## 2021-04-28 MED ORDER — METHYLERGONOVINE MALEATE 0.2 MG/ML IJ SOLN
INTRAMUSCULAR | Status: AC
  Filled 2021-04-28: qty 1

## 2021-04-28 MED ORDER — SOD CITRATE-CITRIC ACID 500-334 MG/5ML PO SOLN: 30.0000 mL | ORAL | Status: DC | PRN

## 2021-04-28 MED ORDER — HYDRALAZINE HCL 20 MG/ML IJ SOLN: 10.0000 mg | INTRAMUSCULAR | Status: DC | PRN

## 2021-04-28 MED ORDER — FENTANYL-BUPIVACAINE-NACL 0.5-0.125-0.9 MG/250ML-% EP SOLN
EPIDURAL | Status: AC
  Filled 2021-04-28: qty 250

## 2021-04-28 MED ORDER — ACETAMINOPHEN 500 MG PO TABS: 1000.0000 mg | ORAL_TABLET | Freq: Four times a day (QID) | ORAL | Status: DC | PRN

## 2021-04-28 MED ORDER — LACTATED RINGERS IV SOLN
500.0000 mL | Freq: Once | INTRAVENOUS | Status: AC
  Administered 2021-04-28: 500 mL via INTRAVENOUS

## 2021-04-28 MED ORDER — LIDOCAINE HCL (PF) 1 % IJ SOLN
INTRAMUSCULAR | Status: DC | PRN
  Administered 2021-04-28: 3 mL

## 2021-04-28 MED ORDER — LACTATED RINGERS IV SOLN: 500.0000 mL | INTRAVENOUS | Status: DC | PRN

## 2021-04-28 MED ORDER — ONDANSETRON HCL 4 MG/2ML IJ SOLN
4.0000 mg | Freq: Four times a day (QID) | INTRAMUSCULAR | Status: DC | PRN
  Administered 2021-04-28: 4 mg via INTRAVENOUS
  Filled 2021-04-28: qty 2

## 2021-04-28 MED ORDER — EPHEDRINE 5 MG/ML INJ: 10.0000 mg | INTRAVENOUS | Status: DC | PRN

## 2021-04-28 MED ORDER — OXYTOCIN-SODIUM CHLORIDE 30-0.9 UT/500ML-% IV SOLN
1.0000 m[IU]/min | INTRAVENOUS | Status: DC
  Administered 2021-04-29: 7 m[IU]/min via INTRAVENOUS

## 2021-04-28 MED ORDER — CARBOPROST TROMETHAMINE 250 MCG/ML IM SOLN
INTRAMUSCULAR | Status: AC
  Filled 2021-04-28: qty 1

## 2021-04-28 MED ORDER — LABETALOL HCL 5 MG/ML IV SOLN: 20.0000 mg | INTRAVENOUS | Status: DC | PRN

## 2021-04-28 MED ORDER — TERBUTALINE SULFATE 1 MG/ML IJ SOLN: 0.2500 mg | Freq: Once | INTRAMUSCULAR | Status: DC | PRN

## 2021-04-28 MED ORDER — MISOPROSTOL 25 MCG QUARTER TABLET
25.0000 ug | ORAL_TABLET | ORAL | Status: DC | PRN
  Filled 2021-04-28: qty 1

## 2021-04-28 MED ORDER — LIDOCAINE-EPINEPHRINE (PF) 1.5 %-1:200000 IJ SOLN
INTRAMUSCULAR | Status: DC | PRN
  Administered 2021-04-28: 3 mL via PERINEURAL

## 2021-04-28 MED ORDER — AMMONIA AROMATIC IN INHA
RESPIRATORY_TRACT | Status: AC
  Filled 2021-04-28: qty 10

## 2021-04-28 MED ORDER — OXYTOCIN-SODIUM CHLORIDE 30-0.9 UT/500ML-% IV SOLN
INTRAVENOUS | Status: AC
  Administered 2021-04-28: 2 m[IU]/min via INTRAVENOUS
  Filled 2021-04-28: qty 500

## 2021-04-28 MED ORDER — OXYTOCIN-SODIUM CHLORIDE 30-0.9 UT/500ML-% IV SOLN
2.5000 [IU]/h | INTRAVENOUS | Status: DC
  Administered 2021-04-29: 2.5 [IU]/h via INTRAVENOUS
  Filled 2021-04-28: qty 500

## 2021-04-28 MED ORDER — PHENYLEPHRINE 40 MCG/ML (10ML) SYRINGE FOR IV PUSH (FOR BLOOD PRESSURE SUPPORT): 80.0000 ug | PREFILLED_SYRINGE | INTRAVENOUS | Status: DC | PRN

## 2021-04-28 MED ORDER — OXYTOCIN 10 UNIT/ML IJ SOLN
INTRAMUSCULAR | Status: AC
  Filled 2021-04-28: qty 2

## 2021-04-28 MED ORDER — FENTANYL-BUPIVACAINE-NACL 0.5-0.125-0.9 MG/250ML-% EP SOLN: 12.0000 mL/h | EPIDURAL | Status: DC | PRN

## 2021-04-28 MED ORDER — OXYTOCIN BOLUS FROM INFUSION
333.0000 mL | Freq: Once | INTRAVENOUS | Status: AC
  Administered 2021-04-29: 333 mL via INTRAVENOUS

## 2021-04-28 NOTE — Progress Notes (Addendum)
Electronic interpreter on stick in use.#[phone]

## 2021-04-28 NOTE — Progress Notes (Signed)
Interpreter on stick [phone]

## 2021-04-28 NOTE — Anesthesia Procedure Notes (Signed)
Epidural Patient location during procedure: OB Start time: 04/28/2021 10:53 PM  Staffing Anesthesiologist: Johny Drilling, MD Performed: anesthesiologist   Preanesthetic Checklist Completed: patient identified, IV checked, site marked, risks and benefits discussed, surgical consent, monitors and equipment checked, pre-op evaluation and timeout performed  Epidural Patient position: sitting Prep: ChloraPrep Patient monitoring: heart rate, continuous pulse ox and blood pressure Approach: midline Location: L3-L4 Injection technique: LOR saline  Needle:  Needle type: Hustead  Needle gauge: 17 G Needle length: 9 cm Needle insertion depth: 6 cm Catheter type: closed end flexible Catheter size: 20 Guage Catheter at skin depth: 10 cm Test dose: negative and 1.5% lidocaine with Epi 1:200 K  Additional Notes Tolerated well. No complications noted.

## 2021-04-28 NOTE — H&P (Signed)
OB History & Physical   History of Present Illness:  Chief Complaint:   HPI:  Candice Walker is a 35 y.o. G20P6 female at [redacted]w[redacted]d dated by [place].  She presents to L&D for IOL  She reports:  -active fetal movement -no leakage of fluid -no vaginal bleeding -no contractions  Pregnancy Issues: 1. AMA 2.Language Barrier 3. CHTN 4. Grand Multiparity 5. Anemia 6. Late to [facility] 7. Diet Controlled GDM    Maternal Medical History:   Past Medical History:  Diagnosis Date   Anemia    Beta thalassemia (HCC)    GERD (gastroesophageal reflux disease)    Gestational diabetes    Hypertension     History reviewed. No pertinent surgical history.  No Known Allergies  Prior to Admission medications   Medication Sig Start Date End Date Taking? Authorizing Provider  ACCU-CHEK GUIDE test strip 1 EACH (1 STRIP TOTAL) 4 (FOUR) TIMES DAILY USE AS INSTRUCTED. 02/22/21  Yes [provider]  Accu-Chek Softclix Lancets lancets SMARTSIG:1 Each Topical 4 Times Daily 02/22/21  Yes [provider]  aspirin 81 MG EC tablet Take 1 tablet by mouth. 01/08/21 01/08/22 Yes [provider]  Blood Pressure Monitor MISC To monitor blood pressure at home during pregnancy. 01/05/21  Yes [provider]  labetalol (NORMODYNE) 100 MG tablet Take 100 mg by mouth 2 (two) times daily. 10/25/20  Yes [provider]  omeprazole (PRILOSEC) 40 MG capsule Take 1 capsule by mouth daily. 01/05/21  Yes [provider]  Prenatal 27-1 MG TABS Take 1 tablet by mouth daily. 11/22/20  Yes [provider]  acetaminophen (TYLENOL) 325 MG tablet Take 325 mg by mouth every 6 (six) hours as needed.    [provider]     Prenatal care site: [facility]   Social History: She  reports that she has never smoked. She has never been exposed to tobacco smoke. She has never used smokeless tobacco. She reports that she does not drink alcohol and does not use drugs.  Family  History: family history includes Diabetes in her father and paternal grandmother; Hypertension in her mother.   Review of Systems: A full review of systems was performed and negative except as noted in the HPI.    Physical Exam:  Vital Signs: BP 123/73 (BP Location: Right Arm)   Pulse 89   Resp 14   Ht 5' (1.524 m)   Wt 64.2 kg   BMI 27.63 kg/m   General:   alert, cooperative, and appears stated age  Skin:  normal  Neurologic:    Alert & oriented x 3  Lungs:   clear to auscultation bilaterally  Heart:   regular rate and rhythm, S1, S2 normal, no murmur, click, rub or gallop  Abdomen:  soft, non-tender; bowel sounds normal; no masses,  no organomegaly  Pelvis:  Exam deferred.  FHT:  140 BPM  Presentations: cephalic  Cervix:    Dilation: 1cm   Effacement: 50%   Station:  -3   Consistency: medium   Position: middle  Extremities: : non-tender, symmetric, no edema bilaterally.  DTRs: +2      Results for orders placed or performed during the hospital encounter of 04/28/21 (from the past 24 hour(s))  Type and screen     Status: None   Collection Time: 04/28/21  8:58 AM  Result Value Ref Range   ABO/RH(D) O POS    Antibody Screen NEG    Sample Expiration  05/01/2021,2359 Performed at [facility], [address] [place], [place] [address]   Rapid HIV screen [facility] L&D dept ONLY)     Status: None   Collection Time: 04/28/21  8:58 AM  Result Value Ref Range   HIV-1 P24 Antigen - HIV24 NON REACTIVE NON REACTIVE   HIV 1/2 Antibodies NON REACTIVE NON REACTIVE   Interpretation (HIV Ag Ab)      A non reactive test result means that HIV 1 or HIV 2 antibodies and HIV 1 p24 antigen were not detected in the specimen.  CBC     Status: Abnormal   Collection Time: 04/28/21  9:58 AM  Result Value Ref Range   WBC 7.5 4.0 - 10.5 K/uL   RBC 4.70 3.87 - 5.11 MIL/uL   Hemoglobin 9.6 (L) 12.0 - 15.0 g/dL   HCT 37.6 (L) 28.3 - 15.1 %   MCV 62.3 (L) 80.0 - 100.0 fL   MCH 20.4  (L) 26.0 - 34.0 pg   MCHC 32.8 30.0 - 36.0 g/dL   RDW 76.1 (H) 60.7 - 37.1 %   Platelets 184 150 - 400 K/uL   nRBC 0.0 0.0 - 0.2 %  Resp Panel by RT-PCR (Flu A&B, Covid) Nasopharyngeal Swab     Status: None   Collection Time: 04/28/21  9:58 AM   Specimen: Nasopharyngeal Swab; Nasopharyngeal(NP) swabs in vial transport medium  Result Value Ref Range   SARS Coronavirus 2 by RT PCR NEGATIVE NEGATIVE   Influenza A by PCR NEGATIVE NEGATIVE   Influenza B by PCR NEGATIVE NEGATIVE  Urinalysis, Complete w Microscopic Urine, Clean Catch     Status: Abnormal   Collection Time: 04/28/21  9:58 AM  Result Value Ref Range   Color, Urine YELLOW (A) YELLOW   APPearance HAZY (A) CLEAR   Specific Gravity, Urine 1.020 1.005 - 1.030   pH 6.0 5.0 - 8.0   Glucose, UA 150 (A) NEGATIVE mg/dL   Hgb urine dipstick NEGATIVE NEGATIVE   Bilirubin Urine NEGATIVE NEGATIVE   Ketones, ur NEGATIVE NEGATIVE mg/dL   Protein, ur NEGATIVE NEGATIVE mg/dL   Nitrite NEGATIVE NEGATIVE   Leukocytes,Ua NEGATIVE NEGATIVE   RBC / HPF 0-5 0 - 5 RBC/hpf   WBC, UA 0-5 0 - 5 WBC/hpf   Bacteria, UA FEW (A) NONE SEEN   Squamous Epithelial / LPF 6-10 0 - 5   Mucus PRESENT   Protein / creatinine ratio, urine     Status: None   Collection Time: 04/28/21  9:58 AM  Result Value Ref Range   Creatinine, Urine 159 mg/dL   Total Protein, Urine 17 mg/dL   Protein Creatinine Ratio 0.11 0.00 - 0.15 mg/mg[Cre]  Comprehensive metabolic panel     Status: Abnormal   Collection Time: 04/28/21  9:58 AM  Result Value Ref Range   Sodium 136 135 - 145 mmol/L   Potassium 3.5 3.5 - 5.1 mmol/L   Chloride 106 98 - 111 mmol/L   CO2 22 22 - 32 mmol/L   Glucose, Bld 76 70 - 99 mg/dL   BUN 8 6 - 20 mg/dL   Creatinine, Ser 0.62 0.44 - 1.00 mg/dL   Calcium 8.4 (L) 8.9 - 10.3 mg/dL   Total Protein 6.2 (L) 6.5 - 8.1 g/dL   Albumin 3.1 (L) 3.5 - 5.0 g/dL   AST 19 15 - 41 U/L   ALT 11 0 - 44 U/L   Alkaline Phosphatase 141 (H) 38 - 126 U/L   Total  Bilirubin  0.8 0.3 - 1.2 mg/dL   GFR, Estimated >96 >04 mL/min   Anion gap 8 5 - 15    Pertinent Results:  Prenatal Labs: Blood type/Rh O  pos  Antibody screen neg  Rubella Immune  Varicella Immune  RPR NR  HBsAg Neg  HIV NR  GC neg  Chlamydia neg  Genetic screening negative  1 hour GTT 220  3 hour GTT   GBS Neg   FHT: FHR: 140 bpm, variability: moderate,  accelerations:  Present,  decelerations:  Absent Category/reactivity:  Category I TOCO: regular, every 2-4 minutes SVE: Dilation: 1.5 / Effacement (%): 50 / Station: -3    Cephalic by [employer]  [place] MFM FETAL BPP WO NON STRESS  Result Date: 04/24/2021 ----------------------------------------------------------------------  OBSTETRICS REPORT                       (Signed Final 04/24/2021 09:21 am) ---------------------------------------------------------------------- Patient Info  ID #:       [id number]                          D.O.B.:  26-Jun-1986 (35 yrs)  Name:       Candice Walker                  Visit Date: 04/24/2021 08:34 am ---------------------------------------------------------------------- Performed By  Attending:        Lin Landsman      Ref. Address:     [address]                    MD                                                             [place], [place],                                                             [place] [address]  Performed By:     Lorn Junes,           Location[facility]         [facility]                    RDMS, RDCS                               Fetal Care at                                                             [facility]  Referred By:      [facility]                    MCVEY ---------------------------------------------------------------------- Orders  #  Description  Code        Ordered By  1  [place] MFM FETAL BPP WO NON               [id number]    CORENTHIAN     STRESS                                            BOOKER  ----------------------------------------------------------------------  #  Order #                     Accession #                Episode #  1  [id number]                   [id number]                 [id number] ---------------------------------------------------------------------- Indications  [redacted] weeks gestation of pregnancy                Z3A.36  Pre-existing essential hypertension            O10.013  complicating pregnancy, third trimester  Gestational diabetes in pregnancy,             O24.419  unspecified control  Anemia during pregnancy in third trimester     O22.013  Advanced maternal age multigravida 64+,        O36.523  third trimester  Grand multiparity, antepartum                  O09.40 ---------------------------------------------------------------------- Fetal Evaluation  Num Of Fetuses:         1  Fetal Heart Rate(bpm):  157  Cardiac Activity:       Observed  Presentation:           Cephalic  Placenta:               Anterior  Amniotic Fluid  AFI FV:      Within normal limits  AFI Sum(cm)     %Tile       Largest Pocket(cm)  15.2            57          4.6  RUQ(cm)       RLQ(cm)       LUQ(cm)        LLQ(cm)  2.9           4.6           4.5            3.2 ---------------------------------------------------------------------- Biophysical Evaluation  Amniotic F.V:   Within normal limits       F. Tone:        Observed  F. Movement:    Observed                   Score:          8/8  F. Breathing:   Observed ---------------------------------------------------------------------- OB History  Gravidity:    7         Term:   6  Living:       6 ---------------------------------------------------------------------- Gestational Age  LMP:           34w 6d        Date:  08/23/20  EDD:   05/30/21  Best:          36w 5d     Det. By:  Previous Ultrasound      EDD:   05/17/21                                      (01/05/21) ---------------------------------------------------------------------- Impression   Antenatal testing performed given maternal chronic  hypertension and A1GDM  The biophysical profile was 8/8 with good fetal movement and  amniotic fluid volume.  She is scheduled for delivery at 37 weeks. She reports good  fetal movement and normal blood glucose measurements. ---------------------------------------------------------------------- Recommendations  Follow up as clinically indicated. ----------------------------------------------------------------------               Lin Landsman, MD Electronically Signed Final Report   04/24/2021 09:21 am ----------------------------------------------------------------------  [place] MFM FETAL BPP WO NON STRESS  Result Date: 04/03/2021 ----------------------------------------------------------------------  OBSTETRICS REPORT                       (Signed Final 04/03/2021 11:58 am) ---------------------------------------------------------------------- Patient Info  ID #:       [id number]                          D.O.B.:  [birth year] (35 yrs)  Name:       Candice Walker                  Visit Date: 04/03/2021 08:41 am ---------------------------------------------------------------------- Performed By  Attending:        Noralee Space MD        Ref. Address:     [address]                                                             [place], [place],                                                             [place] [address]  Performed By:     Lorn Junes,           Location:         [facility]                    RDMS, RDCS                               Fetal Care at                                                             [facility]  Referred By:      [facility]  MCVEY ---------------------------------------------------------------------- Orders  #  Description                           Code        Ordered By  1  [place] MFM OB FOLLOW UP                   76816.01    Lin Landsman  2  [place]  MFM FETAL BPP WO NON               76819.01    CORENTHIAN     STRESS                                            BOOKER ----------------------------------------------------------------------  #  Order #                     Accession #                Episode #  1  [id number]                   [id number]                 [id number]  2  [id number]                   [id number]                 [id number] ---------------------------------------------------------------------- Indications  Pre-existing essential hypertension            O10.013  complicating pregnancy, third trimester  Gestational diabetes in pregnancy,             O24.419  unspecified control  Anemia during pregnancy in third trimester     O82.013  Advanced maternal age multigravida 74+,        O68.523  third trimester  [redacted] weeks gestation of pregnancy                Z3A.33  Grand multiparity, antepartum                  O09.40 ---------------------------------------------------------------------- Fetal Evaluation  Num Of Fetuses:         1  Fetal Heart Rate(bpm):  148  Cardiac Activity:       Observed  Presentation:           Cephalic  Placenta:               Anterior  P. Cord Insertion:      Visualized, central  Amniotic Fluid  AFI FV:      Within normal limits  AFI Sum(cm)     %Tile       Largest Pocket(cm)  12.7            39          3.9  RUQ(cm)       RLQ(cm)       LUQ(cm)        LLQ(cm)  1.9           3.9           3.9            3 ---------------------------------------------------------------------- Biophysical Evaluation  Amniotic F.V:   Within normal limits       F. Tone:        Observed  F. Movement:    Observed                   Score:          8/8  F. Breathing:   Observed ---------------------------------------------------------------------- Biometry  BPD:      85.4  mm     G. Age:  34w 3d         67  %    CI:         72.7   %    70 - 86                                                          FL/HC:      19.7   %    19.4 - 21.8  HC:      318.5  mm      G. Age:  35w 6d         69  %    HC/AC:      1.07        0.96 - 1.11  AC:       298   mm     G. Age:  33w 5d         55  %    FL/BPD:     73.5   %    71 - 87  FL:       62.8  mm     G. Age:  32w 4d         13  %    FL/AC:      21.1   %    20 - 24  HUM:      55.9  mm     G. Age:  32w 4d         33  %  CER:      44.5  mm     G. Age:  34w 4d         44  %  LV:        4.6  mm  CM:        6.2  mm  Est. FW:    2252  gm    4 lb 15 oz      41  % ---------------------------------------------------------------------- OB History  Gravidity:    7         Term:   6  Living:       6 ---------------------------------------------------------------------- Gestational Age  LMP:           31w 6d        Date:  08/23/20                 EDD:   05/30/21  U/S Today:     34w 1d  EDD:   05/14/21  Best:          33w 5d     Det. By:  Previous Ultrasound      EDD:   05/17/21                                      (01/05/21) ---------------------------------------------------------------------- Anatomy  Cranium:               Appears normal         Aortic Arch:            Previously seen  Cavum:                 Appears normal         Ductal Arch:            Previously seen  Ventricles:            Appears normal         Diaphragm:              Appears normal  Choroid Plexus:        Appears normal         Stomach:                Appears normal, left                                                                        sided  Cerebellum:            Appears normal         Abdomen:                Appears normal  Posterior Fossa:       Appears normal         Abdominal Wall:         Previously seen  Nuchal Fold:           Not applicable (>20    Cord Vessels:           Appears normal ([redacted]                         wks GA)                                        vessel cord)  Face:                  Appears normal         Kidneys:                Appear normal                         (orbits and profile)  Lips:                   Appears normal         Bladder:  Appears normal  Heart:                 Appears normal         Spine:                  Previously seen                         (4CH, axis, and                         situs)  RVOT:                  Appears normal         Upper Extremities:      Previously seen  LVOT:                  Appears normal         Lower Extremities:      Previously seen ---------------------------------------------------------------------- Impression  Chronic hypertension.  Well-controlled on labetalol 50 mg  twice daily blood pressure today at her office is 115/77 mmHg.  Gestational diabetes.  Diet controlled.  I reviewed her blood  glucose log and they are within normal range.  Iron deficiency anemia.  Patient received iron transfusions.  Her recent hemoglobin is 9 g/DL.  Serum ferritin and iron  have returned to normal.  Fetal growth is appropriate for gestational age.  Amniotic fluid  is normal and good fetal activity seen.  Antenatal testing is  reassuring.  BPP 8/8. ---------------------------------------------------------------------- Recommendations  Continue weekly antenatal testing till delivery. ----------------------------------------------------------------------                  Noralee Space, MD Electronically Signed Final Report   04/03/2021 11:58 am ----------------------------------------------------------------------  [place] MFM OB FOLLOW UP  Result Date: 04/03/2021 ----------------------------------------------------------------------  OBSTETRICS REPORT                       (Signed Final 04/03/2021 11:58 am) ---------------------------------------------------------------------- Patient Info  ID #:       [id number]                          D.O.B.:  [birth year] (35 yrs)  Name:       Candice Walker                  Visit Date: 04/03/2021 08:41 am ---------------------------------------------------------------------- Performed By  Attending:        Noralee Space MD        Ref.  Address:     [address]                                                             [place], [place],                                                             [place] [address]  Performed By:     Lorn Junes,  Location:         [facility]                    RDMS, RDCS                               Fetal Care at                                                             [facility]  Referred By:      [facility] A                    MCVEY ---------------------------------------------------------------------- Orders  #  Description                           Code        Ordered By  1  [place] MFM OB FOLLOW UP                   09811.91    Lin Landsman  2  [place] MFM FETAL BPP WO NON               76819.01    CORENTHIAN     STRESS                                            BOOKER ----------------------------------------------------------------------  #  Order #                     Accession #                Episode #  1  [id number]                   [id number]                 [id number]  2  [id number]                   [id number]                 [id number] ---------------------------------------------------------------------- Indications  Pre-existing essential hypertension            O10.013  complicating pregnancy, third trimester  Gestational diabetes in pregnancy,             O24.419  unspecified control  Anemia during pregnancy in third trimester     O62.013  Advanced maternal age multigravida 22+,        O60.523  third trimester  [redacted] weeks gestation of pregnancy                Z3A.33  Grand multiparity, antepartum  O09.40 ---------------------------------------------------------------------- Fetal Evaluation  Num Of Fetuses:         1  Fetal Heart Rate(bpm):  148  Cardiac Activity:       Observed  Presentation:           Cephalic  Placenta:               Anterior  P. Cord Insertion:      Visualized, central  Amniotic Fluid  AFI FV:       Within normal limits  AFI Sum(cm)     %Tile       Largest Pocket(cm)  12.7            39          3.9  RUQ(cm)       RLQ(cm)       LUQ(cm)        LLQ(cm)  1.9           3.9           3.9            3 ---------------------------------------------------------------------- Biophysical Evaluation  Amniotic F.V:   Within normal limits       F. Tone:        Observed  F. Movement:    Observed                   Score:          8/8  F. Breathing:   Observed ---------------------------------------------------------------------- Biometry  BPD:      85.4  mm     G. Age:  34w 3d         67  %    CI:         72.7   %    70 - 86                                                          FL/HC:      19.7   %    19.4 - 21.8  HC:      318.5  mm     G. Age:  35w 6d         69  %    HC/AC:      1.07        0.96 - 1.11  AC:       298   mm     G. Age:  33w 5d         55  %    FL/BPD:     73.5   %    71 - 87  FL:       62.8  mm     G. Age:  32w 4d         13  %    FL/AC:      21.1   %    20 - 24  HUM:      55.9  mm     G. Age:  32w 4d         33  %  CER:      44.5  mm     G. Age:  34w 4d         44  %  LV:  4.6  mm  CM:        6.2  mm  Est. FW:    2252  gm    4 lb 15 oz      41  % ---------------------------------------------------------------------- OB History  Gravidity:    7         Term:   6  Living:       6 ---------------------------------------------------------------------- Gestational Age  LMP:           31w 6d        Date:  08/23/20                 EDD:   05/30/21  U/S Today:     34w 1d                                        EDD:   05/14/21  Best:          33w 5d     Det. By:  Previous Ultrasound      EDD:   05/17/21                                      (01/05/21) ---------------------------------------------------------------------- Anatomy  Cranium:               Appears normal         Aortic Arch:            Previously seen  Cavum:                 Appears normal         Ductal Arch:            Previously seen   Ventricles:            Appears normal         Diaphragm:              Appears normal  Choroid Plexus:        Appears normal         Stomach:                Appears normal, left                                                                        sided  Cerebellum:            Appears normal         Abdomen:                Appears normal  Posterior Fossa:       Appears normal         Abdominal Wall:         Previously seen  Nuchal Fold:           Not applicable (>20    Cord Vessels:           Appears normal ([redacted]  wks GA)                                        vessel cord)  Face:                  Appears normal         Kidneys:                Appear normal                         (orbits and profile)  Lips:                  Appears normal         Bladder:                Appears normal  Heart:                 Appears normal         Spine:                  Previously seen                         (4CH, axis, and                         situs)  RVOT:                  Appears normal         Upper Extremities:      Previously seen  LVOT:                  Appears normal         Lower Extremities:      Previously seen ---------------------------------------------------------------------- Impression  Chronic hypertension.  Well-controlled on labetalol 50 mg  twice daily blood pressure today at her office is 115/77 mmHg.  Gestational diabetes.  Diet controlled.  I reviewed her blood  glucose log and they are within normal range.  Iron deficiency anemia.  Patient received iron transfusions.  Her recent hemoglobin is 9 g/DL.  Serum ferritin and iron  have returned to normal.  Fetal growth is appropriate for gestational age.  Amniotic fluid  is normal and good fetal activity seen.  Antenatal testing is  reassuring.  BPP 8/8. ---------------------------------------------------------------------- Recommendations  Continue weekly antenatal testing till delivery.  ----------------------------------------------------------------------                  Noralee Space, MD Electronically Signed Final Report   04/03/2021 11:58 am ----------------------------------------------------------------------    Assessment:  Jamala Kohen is a 35 y.o. G7P6 female at [redacted]w[redacted]d present to L&D for IOL for GDM,CHTN   Plan:  1. Admit to Labor & Delivery; consents reviewed and obtained  2. Fetal Well being  - Fetal Tracing: Cat 1 - GBS neg - Presentation: vertex confirmed by SVE   3. Routine OB: - Prenatal labs reviewed, as above - Rh pos - CBC & T&S on admit - Clear fluids, IVF  4. Monitoring of Induction of Labor -  Contractions external toco in place -  Plan for induction with Cytotec, Pitocin -  Plan for continuous fetal monitoring  -  Maternal pain control as desired: regional anesthesia - Anticipate vaginal  delivery  5. Post Partum Planning: - Infant feeding: Breast - Contraception: BTL  Chari Manning, CNM 04/28/2021 12:54 PM

## 2021-04-28 NOTE — Progress Notes (Signed)
Labor Progress Note  Candice Walker is a 35 y.o. G7P6 at [redacted]w[redacted]d by ultrasound admitted for induction of labor due to Gestational diabetes and Hypertension.  Subjective: resting in bed with spouse and sponsor at bedside  Objective: BP 124/81   Pulse 83   Temp 98.8 F (37.1 C) (Axillary)   Resp 16   Ht 5' (1.524 m)   Wt 64.2 kg   BMI 27.63 kg/m  Notable VS details:   Fetal Assessment: FHT:  FHR: 140 bpm, variability: moderate,  accelerations:  Present,  decelerations:  Absent Category/reactivity:  Category I UC:   regular, every 2-3 minutes SVE:   1/50/-2 Membrane status: intact Amniotic color: N/A  Labs: Lab Results  Component Value Date   WBC 7.5 04/28/2021   HGB 9.6 (L) 04/28/2021   HCT 29.3 (L) 04/28/2021   MCV 62.3 (L) 04/28/2021   PLT 184 04/28/2021    Assessment / Plan: Induction of labor progressing normally, Cytotec placed x1, now [facility] Catheter placed with low dose Pitocin.  Labor: Progressing on Pitocin, will continue to increase then AROM Fetal Wellbeing:  Category I Pain Control:  Epidural and IV pain meds I/D:   GBS Neg Anticipated MOD:  NSVD  Ugonna Keirsey LUCY LORENA Poppi Scantling, CNM 04/28/2021, 5:02 PM

## 2021-04-28 NOTE — Anesthesia Preprocedure Evaluation (Signed)
Anesthesia Evaluation  Patient identified by MRN, date of birth, ID band Patient awake    Reviewed: Allergy & Precautions, NPO status , Patient's Chart, lab work & pertinent test results  Airway Mallampati: II  TM Distance: >3 FB Neck ROM: Full    Dental  (+) Teeth Intact   Pulmonary neg pulmonary ROS,           Cardiovascular hypertension,      Neuro/Psych negative neurological ROS  negative psych ROS   GI/Hepatic GERD  ,  Endo/Other  diabetes, Gestational  Renal/GU      Musculoskeletal   Abdominal   Peds  Hematology  (+) anemia ,   Anesthesia Other Findings   Reproductive/Obstetrics                             Anesthesia Physical Anesthesia Plan  ASA: 3 and emergent  Anesthesia Plan: Epidural   Post-op Pain Management:    Induction:   PONV Risk Score and Plan:   Airway Management Planned:   Additional Equipment:   Intra-op Plan:   Post-operative Plan:   Informed Consent: I have reviewed the patients History and Physical, chart, labs and discussed the procedure including the risks, benefits and alternatives for the proposed anesthesia with the patient or authorized representative who has indicated his/her understanding and acceptance.       Plan Discussed with:   Anesthesia Plan Comments: (OSA score 1)        Anesthesia Quick Evaluation

## 2021-04-29 DIAGNOSIS — O1092 Unspecified pre-existing hypertension complicating childbirth: Secondary | ICD-10-CM | POA: Diagnosis not present

## 2021-04-29 DIAGNOSIS — Z3A37 37 weeks gestation of pregnancy: Secondary | ICD-10-CM | POA: Diagnosis not present

## 2021-04-29 DIAGNOSIS — O24424 Gestational diabetes mellitus in childbirth, insulin controlled: Secondary | ICD-10-CM | POA: Diagnosis not present

## 2021-04-29 LAB — GLUCOSE, CAPILLARY
Glucose-Capillary: 82 mg/dL (ref 70–99)
Glucose-Capillary: 87 mg/dL (ref 70–99)

## 2021-04-29 LAB — RPR: RPR Ser Ql: NONREACTIVE

## 2021-04-29 MED ORDER — CALCIUM CARBONATE ANTACID 500 MG PO CHEW
CHEWABLE_TABLET | ORAL | Status: AC
  Filled 2021-04-29: qty 2

## 2021-04-29 MED ORDER — SODIUM CHLORIDE 0.9% FLUSH: 3.0000 mL | INTRAVENOUS | Status: DC | PRN

## 2021-04-29 MED ORDER — ACETAMINOPHEN 500 MG PO TABS
ORAL_TABLET | ORAL | Status: AC
  Filled 2021-04-29: qty 2

## 2021-04-29 MED ORDER — OXYCODONE HCL 5 MG PO TABS
5.0000 mg | ORAL_TABLET | ORAL | Status: DC | PRN
  Administered 2021-04-30: 5 mg via ORAL
  Filled 2021-04-29: qty 1

## 2021-04-29 MED ORDER — CALCIUM CARBONATE ANTACID 500 MG PO CHEW
400.0000 mg | CHEWABLE_TABLET | Freq: Once | ORAL | Status: AC
  Administered 2021-04-29: 400 mg via ORAL

## 2021-04-29 MED ORDER — WITCH HAZEL-GLYCERIN EX PADS: 1.0000 "application " | MEDICATED_PAD | CUTANEOUS | Status: DC | PRN

## 2021-04-29 MED ORDER — SENNOSIDES-DOCUSATE SODIUM 8.6-50 MG PO TABS: 2.0000 | ORAL_TABLET | Freq: Every day | ORAL | Status: DC

## 2021-04-29 MED ORDER — BISACODYL 10 MG RE SUPP
10.0000 mg | Freq: Every day | RECTAL | Status: DC | PRN
  Filled 2021-04-29: qty 1

## 2021-04-29 MED ORDER — SODIUM CHLORIDE 0.9 % IV SOLN: 250.0000 mL | INTRAVENOUS | Status: DC | PRN

## 2021-04-29 MED ORDER — MEASLES, MUMPS & RUBELLA VAC IJ SOLR
0.5000 mL | Freq: Once | INTRAMUSCULAR | Status: DC
  Filled 2021-04-29: qty 0.5

## 2021-04-29 MED ORDER — METOCLOPRAMIDE HCL 10 MG PO TABS
10.0000 mg | ORAL_TABLET | Freq: Once | ORAL | Status: AC
  Administered 2021-04-30: 10 mg via ORAL
  Filled 2021-04-29: qty 1

## 2021-04-29 MED ORDER — BENZOCAINE-MENTHOL 20-0.5 % EX AERO: 1.0000 "application " | INHALATION_SPRAY | CUTANEOUS | Status: DC | PRN

## 2021-04-29 MED ORDER — SIMETHICONE 80 MG PO CHEW: 80.0000 mg | CHEWABLE_TABLET | ORAL | Status: DC | PRN

## 2021-04-29 MED ORDER — ACETAMINOPHEN 325 MG PO TABS
650.0000 mg | ORAL_TABLET | ORAL | Status: DC | PRN
  Administered 2021-04-29 – 2021-04-30 (×3): 650 mg via ORAL
  Filled 2021-04-29 (×3): qty 2

## 2021-04-29 MED ORDER — ONDANSETRON HCL 4 MG PO TABS
4.0000 mg | ORAL_TABLET | ORAL | Status: DC | PRN
  Filled 2021-04-29: qty 1

## 2021-04-29 MED ORDER — FAMOTIDINE 20 MG PO TABS
40.0000 mg | ORAL_TABLET | Freq: Once | ORAL | Status: AC
Start: 2021-04-29 — End: 2021-04-30
  Administered 2021-04-30: 40 mg via ORAL
  Filled 2021-04-29: qty 2

## 2021-04-29 MED ORDER — SODIUM CHLORIDE 0.9% FLUSH
3.0000 mL | Freq: Two times a day (BID) | INTRAVENOUS | Status: DC
  Administered 2021-04-30: 3 mL via INTRAVENOUS

## 2021-04-29 MED ORDER — LACTATED RINGERS IV SOLN
INTRAVENOUS | Status: DC
  Administered 2021-04-30: 20 mL via INTRAVENOUS

## 2021-04-29 MED ORDER — IBUPROFEN 600 MG PO TABS
600.0000 mg | ORAL_TABLET | Freq: Four times a day (QID) | ORAL | Status: DC
  Administered 2021-04-29 – 2021-04-30 (×3): 600 mg via ORAL
  Filled 2021-04-29 (×4): qty 1

## 2021-04-29 MED ORDER — FLEET ENEMA 7-19 GM/118ML RE ENEM: 1.0000 | ENEMA | Freq: Every day | RECTAL | Status: DC | PRN

## 2021-04-29 MED ORDER — TETANUS-DIPHTH-ACELL PERTUSSIS 5-2.5-18.5 LF-MCG/0.5 IM SUSY
0.5000 mL | PREFILLED_SYRINGE | Freq: Once | INTRAMUSCULAR | Status: DC
Start: 2021-04-30 — End: 2021-04-30
  Filled 2021-04-29: qty 0.5

## 2021-04-29 MED ORDER — DIBUCAINE (PERIANAL) 1 % EX OINT: 1.0000 "application " | TOPICAL_OINTMENT | CUTANEOUS | Status: DC | PRN

## 2021-04-29 MED ORDER — LABETALOL HCL 100 MG PO TABS
100.0000 mg | ORAL_TABLET | Freq: Two times a day (BID) | ORAL | Status: DC
  Administered 2021-04-30: 100 mg via ORAL
  Filled 2021-04-29 (×2): qty 1

## 2021-04-29 MED ORDER — IBUPROFEN 600 MG PO TABS
ORAL_TABLET | ORAL | Status: AC
  Administered 2021-04-29: 600 mg via ORAL
  Filled 2021-04-29: qty 1

## 2021-04-29 MED ORDER — COCONUT OIL OIL
1.0000 "application " | TOPICAL_OIL | Status: DC | PRN
  Filled 2021-04-29: qty 120

## 2021-04-29 MED ORDER — ZOLPIDEM TARTRATE 5 MG PO TABS: 5.0000 mg | ORAL_TABLET | Freq: Every evening | ORAL | Status: DC | PRN

## 2021-04-29 MED ORDER — PRENATAL MULTIVITAMIN CH
1.0000 | ORAL_TABLET | Freq: Every day | ORAL | Status: DC
  Administered 2021-04-29: 1 via ORAL
  Filled 2021-04-29: qty 1

## 2021-04-29 MED ORDER — ONDANSETRON HCL 4 MG/2ML IJ SOLN: 4.0000 mg | INTRAMUSCULAR | Status: DC | PRN

## 2021-04-29 MED ORDER — DIPHENHYDRAMINE HCL 25 MG PO CAPS: 25.0000 mg | ORAL_CAPSULE | Freq: Four times a day (QID) | ORAL | Status: DC | PRN

## 2021-04-29 NOTE — Progress Notes (Signed)
Pt stated she took her own bp medication at 5pm. Instructed pt to not take any home meds while she is here at the hospital via interpreter. Pt stated understands and will not take anymore.

## 2021-04-29 NOTE — Discharge Summary (Signed)
Obstetrical Discharge Summary  Patient Name: Candice Walker DOB: [birth year] MRN: [id number]  Date of Admission: 04/28/2021 Date of Delivery: 04/29/21  Delivered by: Chari Manning CNM/ Dr. Dalbert Garnet Date of Discharge: 04/30/2021  Primary OB: [facility] LMP:No LMP recorded. Patient is pregnant. EDC Estimated Date of Delivery: 05/17/21 Gestational Age at Delivery: [redacted]w[redacted]d   Antepartum complications:  1. AMA 2.Language Barrier 3. CHTN 4. Grand Multiparity 5. Anemia 6. Late to [facility] 7. 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 Patient received epidural per request, AROM performed.cervix remained unchanged for hours with position changes and Pitocin. IUPC placed, Tums given, Pitocin running at half dose. Patient complete at 0730. Remained afebrile throughout the labor process. CABG and VS WNL. Delivery Type: spontaneous vaginal delivery Anesthesia: epidural Placenta: spontaneous Laceration: none Episiotomy: none Newborn Data: Live born female "Candice Walker" Birth Weight:  6#4 APGAR: 8, 9  Newborn Delivery   Birth date/time: 04/29/2021 07:58:00 Delivery type: Vaginal, Spontaneous     Postpartum Procedures: tubal ligation on postpartum day 1  Edinburgh:  Edinburgh Postnatal Depression Scale Screening Tool 04/30/2021 04/30/2021   I have been able to laugh and see the funny side of things. 0 (No Data)  I have looked forward with enjoyment to things. 0 -  I have blamed myself unnecessarily when things went wrong. 0 -  I have been anxious or worried for no good reason. 0 -  I have felt scared or panicky for no good reason. 0 -  Things have been getting on top of me. 0 -  I have been so unhappy that I have had difficulty sleeping. 2 -  I have felt sad or miserable. 0 -  I have been so unhappy that I have been crying. 0 -  The thought of harming myself has occurred to me. 0 -  Edinburgh Postnatal Depression Scale Total 2 -     Post partum course:  Patient had an uncomplicated postpartum course.  She received a postpartum tubal ligation. By time of discharge on PPD#1, her pain was controlled on oral pain medications; she had appropriate lochia and was ambulating, voiding without difficulty and tolerating regular diet.  She was deemed stable for discharge to home.     Discharge Physical Exam:  BP 125/81 (BP Location: Right Arm)   Pulse 66   Temp 98.7 F (37.1 C) (Oral)   Resp 18   Ht 5' (1.524 m)   Wt 64.2 kg   SpO2 99% Comment: Room Air  BMI 27.64 kg/m   General: NAD CV: RRR Pulm: CTABL, nl effort ABD: s/nd/nt, fundus firm and below the umbilicus Lochia: moderate Perineum:minimal edema/intact DVT Evaluation: LE non-ttp, no evidence of DVT on exam.  Umbilical incision from postpartum tubal ligation:CDI  Hemoglobin  Date Value Ref Range Status  04/30/2021 9.0 (L) 12.0 - 15.0 g/dL Final   HCT  Date Value Ref Range Status  04/30/2021 27.8 (L) 36.0 - 46.0 %  Final     Disposition: stable, discharge to home. Baby Feeding: breast milk Baby Disposition: home with mom  Rh Immune globulin given: N/A Rubella vaccine given: Immune Varivax vaccine given: Immune Flu vaccine given in AP or PP setting: offer PP Tdap vaccine given in AP or PP setting: 02/21/21  Contraception: BTL  Prenatal Labs:  Blood  type/Rh O  pos  Antibody screen neg  Rubella Immune  Varicella Immune  RPR NR  HBsAg Neg  HIV NR  GC neg  Chlamydia neg  Genetic screening negative  1 hour GTT 220  3 hour GTT    GBS Neg   (copy from H&P)  Plan:  Bryer Rix was discharged to home in good condition. Follow-up appointment with delivering provider in 2 weeks.  Discharge Medications: Allergies as of 04/30/2021   No Known Allergies      Medication List     STOP taking these medications    Accu-Chek Guide test strip Generic drug: glucose blood   Accu-Chek Softclix Lancets lancets   aspirin 81 MG EC tablet   Blood Pressure Monitor Misc   omeprazole 40 MG capsule Commonly known as: PRILOSEC       TAKE these medications    acetaminophen 325 MG tablet Commonly known as: Tylenol Take 2 tablets (650 mg total) by mouth every 4 (four) hours as needed (for pain scale < 4). What changed:  how much to take when to take this reasons to take this   benzocaine-Menthol 20-0.5 % Aero Commonly known as: DERMOPLAST Apply 1 application topically as needed for irritation (perineal discomfort).   dibucaine 1 % Oint Commonly known as: NUPERCAINAL Place 1 application rectally as needed for hemorrhoids (if tucks not working).   ibuprofen 600 MG tablet Commonly known as: ADVIL Take 1 tablet (600 mg total) by mouth every 6 (six) hours.   labetalol 100 MG tablet Commonly known as: NORMODYNE Take 1 tablet (100 mg total) by mouth 2 (two) times daily.   oxyCODONE 5 MG immediate release tablet Commonly known as: Oxy IR/ROXICODONE Take 1 tablet (5 mg total) by mouth every 6 (six) hours as needed for up to 3 days for moderate pain (pain scale 4-7).   prenatal multivitamin Tabs tablet Take 1 tablet by mouth daily at 12 noon. What changed:  medication strength when to take this   senna-docusate 8.6-50 MG tablet Commonly known as: Senokot-S Take 2 tablets by mouth daily. Start taking on: May 01, 2021   witch hazel-glycerin pad Commonly known as: TUCKS Apply 1 application topically as needed for hemorrhoids (for pain).         Follow-up Information     Christeen Douglas, MD Follow up in 2 week(s).   Specialty: Obstetrics and Gynecology Why: postop visit Contact information: [address] [place] [address] [phone]                 Signed: Randa Ngo, CNM 04/30/2021

## 2021-04-29 NOTE — Progress Notes (Signed)
Electronic interpreter used [phone] for peds, Dr Pricilla Holm newborn assessment.  Plans for Circumcision discussed with father of baby and questions answered.

## 2021-04-29 NOTE — Plan of Care (Signed)
  Problem: Pain Managment: Goal: General experience of comfort will improve Outcome: Progressing   Problem: Safety: Goal: Ability to remain free from injury will improve Outcome: Progressing   Problem: Life Cycle: Goal: Chance of risk for complications during the postpartum period will decrease Outcome: Progressing

## 2021-04-30 ENCOUNTER — Inpatient Hospital Stay: Payer: Medicaid Other | Admitting: Anesthesiology

## 2021-04-30 ENCOUNTER — Encounter
Admission: EM | Disposition: A | Discharge status: Home / Self Care | Payer: Self-pay | Source: Home / Self Care | Attending: Obstetrics

## 2021-04-30 ENCOUNTER — Encounter: Payer: Self-pay | Admitting: Obstetrics and Gynecology

## 2021-04-30 DIAGNOSIS — Z302 Encounter for sterilization: Secondary | ICD-10-CM | POA: Diagnosis not present

## 2021-04-30 HISTORY — PX: TUBAL LIGATION: SHX77

## 2021-04-30 LAB — CBC
HCT: 27.8 % — ABNORMAL LOW (ref 36.0–46.0)
Hemoglobin: 9 g/dL — ABNORMAL LOW (ref 12.0–15.0)
MCH: 20.5 pg — ABNORMAL LOW (ref 26.0–34.0)
MCHC: 32.4 g/dL (ref 30.0–36.0)
MCV: 63.2 fL — ABNORMAL LOW (ref 80.0–100.0)
Platelets: 182 10*3/uL (ref 150–400)
RBC: 4.4 MIL/uL (ref 3.87–5.11)
RDW: 16.1 % — ABNORMAL HIGH (ref 11.5–15.5)
WBC: 10.6 10*3/uL — ABNORMAL HIGH (ref 4.0–10.5)
nRBC: 0 % (ref 0.0–0.2)

## 2021-04-30 LAB — HEMOGLOBIN A1C
Hgb A1c MFr Bld: 5.4 % (ref 4.8–5.6)
Mean Plasma Glucose: 108 mg/dL

## 2021-04-30 SURGERY — LIGATION, FALLOPIAN TUBE, POSTPARTUM
Anesthesia: Spinal | Laterality: Bilateral

## 2021-04-30 MED ORDER — MIDAZOLAM HCL 2 MG/2ML IJ SOLN
INTRAMUSCULAR | Status: AC
  Filled 2021-04-30: qty 2

## 2021-04-30 MED ORDER — ACETAMINOPHEN 325 MG PO TABS: 650.0000 mg | ORAL_TABLET | ORAL | Status: DC | PRN

## 2021-04-30 MED ORDER — BUPIVACAINE HCL (PF) 0.75 % IJ SOLN
INTRAMUSCULAR | Status: DC | PRN
  Administered 2021-04-30: 2 mL

## 2021-04-30 MED ORDER — ONDANSETRON HCL 4 MG/2ML IJ SOLN
INTRAMUSCULAR | Status: DC | PRN
  Administered 2021-04-30: 4 mg via INTRAVENOUS

## 2021-04-30 MED ORDER — DIBUCAINE (PERIANAL) 1 % EX OINT: 1.0000 "application " | TOPICAL_OINTMENT | CUTANEOUS | Status: DC | PRN

## 2021-04-30 MED ORDER — PRENATAL MULTIVITAMIN CH: 1.0000 | ORAL_TABLET | Freq: Every day | ORAL | Status: DC

## 2021-04-30 MED ORDER — IBUPROFEN 600 MG PO TABS: 600.0000 mg | ORAL_TABLET | Freq: Four times a day (QID) | ORAL | 0 refills | Status: DC

## 2021-04-30 MED ORDER — FENTANYL CITRATE (PF) 100 MCG/2ML IJ SOLN
INTRAMUSCULAR | Status: DC | PRN
  Administered 2021-04-30: 15 ug via INTRATHECAL

## 2021-04-30 MED ORDER — LABETALOL HCL 100 MG PO TABS: 100.0000 mg | ORAL_TABLET | Freq: Two times a day (BID) | ORAL | 0 refills | Status: DC

## 2021-04-30 MED ORDER — SENNOSIDES-DOCUSATE SODIUM 8.6-50 MG PO TABS: 2.0000 | ORAL_TABLET | Freq: Every day | ORAL | 0 refills | Status: DC

## 2021-04-30 MED ORDER — BUPIVACAINE HCL (PF) 0.5 % IJ SOLN
INTRAMUSCULAR | Status: AC
  Filled 2021-04-30: qty 30

## 2021-04-30 MED ORDER — BENZOCAINE-MENTHOL 20-0.5 % EX AERO: 1.0000 "application " | INHALATION_SPRAY | CUTANEOUS | Status: DC | PRN

## 2021-04-30 MED ORDER — PROMETHAZINE HCL 25 MG/ML IJ SOLN: 6.2500 mg | INTRAMUSCULAR | Status: DC | PRN

## 2021-04-30 MED ORDER — BUPIVACAINE HCL 0.5 % IJ SOLN
INTRAMUSCULAR | Status: DC | PRN
  Administered 2021-04-30: 9 mL

## 2021-04-30 MED ORDER — SODIUM CHLORIDE 0.9 % IV SOLN
INTRAVENOUS | Status: DC | PRN
  Administered 2021-04-30: 10 ug/min via INTRAVENOUS

## 2021-04-30 MED ORDER — MEPERIDINE HCL 25 MG/ML IJ SOLN: 6.2500 mg | INTRAMUSCULAR | Status: DC | PRN

## 2021-04-30 MED ORDER — 0.9 % SODIUM CHLORIDE (POUR BTL) OPTIME
TOPICAL | Status: DC | PRN
  Administered 2021-04-30: 2 mL

## 2021-04-30 MED ORDER — MIDAZOLAM HCL 5 MG/5ML IJ SOLN
INTRAMUSCULAR | Status: DC | PRN
Start: 2021-04-30 — End: 2021-04-30
  Administered 2021-04-30: 2 mg via INTRAVENOUS

## 2021-04-30 MED ORDER — OXYCODONE HCL 5 MG PO TABS: 5.0000 mg | ORAL_TABLET | Freq: Once | ORAL | Status: DC | PRN

## 2021-04-30 MED ORDER — WITCH HAZEL-GLYCERIN EX PADS: 1.0000 "application " | MEDICATED_PAD | CUTANEOUS | 12 refills | Status: DC | PRN

## 2021-04-30 MED ORDER — OXYCODONE HCL 5 MG PO TABS: 5.0000 mg | ORAL_TABLET | Freq: Four times a day (QID) | ORAL | 0 refills | Status: DC | PRN

## 2021-04-30 MED ORDER — FENTANYL CITRATE (PF) 100 MCG/2ML IJ SOLN: 25.0000 ug | INTRAMUSCULAR | Status: DC | PRN

## 2021-04-30 MED ORDER — OXYCODONE HCL 5 MG PO TABS: 5.0000 mg | ORAL_TABLET | Freq: Four times a day (QID) | ORAL | 0 refills | Status: AC | PRN

## 2021-04-30 MED ORDER — OXYCODONE HCL 5 MG/5ML PO SOLN: 5.0000 mg | Freq: Once | ORAL | Status: DC | PRN

## 2021-04-30 MED ORDER — FENTANYL CITRATE (PF) 100 MCG/2ML IJ SOLN
INTRAMUSCULAR | Status: AC
  Filled 2021-04-30: qty 2

## 2021-04-30 SURGICAL SUPPLY — 38 items
APPLICATOR COTTON TIP 6 STRL (MISCELLANEOUS) IMPLANT
APPLICATOR COTTON TIP 6IN STRL (MISCELLANEOUS) IMPLANT
BLADE SURG SZ11 CARB STEEL (BLADE) ×2 IMPLANT
CHLORAPREP W/TINT 26 (MISCELLANEOUS) ×2 IMPLANT
DERMABOND ADVANCED (GAUZE/BANDAGES/DRESSINGS) ×1
DERMABOND ADVANCED .7 DNX12 (GAUZE/BANDAGES/DRESSINGS) ×1 IMPLANT
DRAPE LAPAROTOMY 77X122 PED (DRAPES) ×2 IMPLANT
DRSG TEGADERM 2-3/8X2-3/4 SM (GAUZE/BANDAGES/DRESSINGS) ×2 IMPLANT
DRSG TEGADERM 4X4.75 (GAUZE/BANDAGES/DRESSINGS) IMPLANT
ELECT CAUTERY BLADE 6.4 (BLADE) ×2 IMPLANT
ELECT REM PT RETURN 9FT ADLT (ELECTROSURGICAL) ×2
ELECTRODE REM PT RTRN 9FT ADLT (ELECTROSURGICAL) ×1 IMPLANT
GAUZE 4X4 16PLY ~~LOC~~+RFID DBL (SPONGE) ×2 IMPLANT
GLOVE SURG SYN 8.0 (GLOVE) ×2 IMPLANT
GOWN STRL REUS W/ TWL LRG LVL3 (GOWN DISPOSABLE) ×1 IMPLANT
GOWN STRL REUS W/ TWL XL LVL3 (GOWN DISPOSABLE) ×1 IMPLANT
GOWN STRL REUS W/TWL LRG LVL3 (GOWN DISPOSABLE) ×1
GOWN STRL REUS W/TWL XL LVL3 (GOWN DISPOSABLE) ×1
KIT TURNOVER KIT A (KITS) ×2 IMPLANT
LABEL OR SOLS (LABEL) ×2 IMPLANT
MANIFOLD NEPTUNE II (INSTRUMENTS) ×2 IMPLANT
NEEDLE HYPO 22GX1.5 SAFETY (NEEDLE) ×2 IMPLANT
NS IRRIG 500ML POUR BTL (IV SOLUTION) ×2 IMPLANT
PACK BASIN MINOR ARMC (MISCELLANEOUS) ×2 IMPLANT
RETRACTOR RING XSMALL (MISCELLANEOUS) ×1 IMPLANT
RTRCTR WOUND ALEXIS 13CM XS SH (MISCELLANEOUS) ×2
SCRUB EXIDINE 4% CHG 4OZ (MISCELLANEOUS) IMPLANT
SEALER TISSUE X1 CVD JAW (INSTRUMENTS) ×2 IMPLANT
SPONGE GAUZE 2X2 8PLY STRL LF (GAUZE/BANDAGES/DRESSINGS) ×2 IMPLANT
STRIP CLOSURE SKIN 1/4X4 (GAUZE/BANDAGES/DRESSINGS) IMPLANT
SUT PLAIN GUT 0 (SUTURE) ×4 IMPLANT
SUT VIC AB 2-0 UR6 27 (SUTURE) IMPLANT
SUT VIC AB 4-0 SH 27 (SUTURE) ×1
SUT VIC AB 4-0 SH 27XANBCTRL (SUTURE) ×1 IMPLANT
SUT VICRYL 0 AB UR-6 (SUTURE) ×2 IMPLANT
SWABSTK COMLB BENZOIN TINCTURE (MISCELLANEOUS) IMPLANT
SYR 10ML LL (SYRINGE) ×2 IMPLANT
WATER STERILE IRR 500ML POUR (IV SOLUTION) IMPLANT

## 2021-04-30 NOTE — Anesthesia Postprocedure Evaluation (Signed)
Anesthesia Post Note  Patient: Candice Walker  Procedure(s) Performed: AN AD HOC LABOR EPIDURAL  Patient location during evaluation: Mother Baby Anesthesia Type: Epidural Level of consciousness: awake and alert Pain management: pain level controlled Vital Signs Assessment: post-procedure vital signs reviewed and stable Respiratory status: spontaneous breathing, nonlabored ventilation and respiratory function stable Cardiovascular status: stable Postop Assessment: no headache, no backache and epidural receding Anesthetic complications: no   No notable events documented.   Last Vitals:  Vitals:   04/30/21 0454 04/30/21 0812  BP: 126/72 132/82  Pulse: 67 70  Resp: 18 18  Temp: (!) 36.4 C 36.9 C  SpO2: 100% 99%    Last Pain:  Vitals:   04/30/21 0812  TempSrc: Oral  PainSc:                  Karoline Caldwell

## 2021-04-30 NOTE — Anesthesia Procedure Notes (Signed)
Date/Time: 04/30/2021 12:41 PM Performed by: Joanette Gula, Mairely Foxworth, CRNA Pre-anesthesia Checklist: Patient identified, Emergency Drugs available, Suction available, Patient being monitored and Timeout performed Patient Re-evaluated:Patient Re-evaluated prior to induction Oxygen Delivery Method: Nasal cannula Induction Type: IV induction

## 2021-04-30 NOTE — Progress Notes (Signed)
Patient ID: Candice Walker, female   DOB: [birth year], 35 y.o.   MRN: [id number] Via internet translator I have explained the BTL procedure . Risks and failure rates have been explained . All questions answered . LAbs reviewed . Medicaid consent signed. Proceed .

## 2021-04-30 NOTE — Progress Notes (Signed)
Mother discharged.  Discharge instructions given via interpreter [facility].  Mother verbalizes understanding.  Transported by auxiliary.

## 2021-04-30 NOTE — Lactation Note (Signed)
This note was copied from a baby's chart. Lactation Consultation Note  Patient Name: Boy Aneka Fagerstrom LOVFI'E Date: 04/30/2021 Reason for consult: Initial assessment;Early term 37-38.6wks Age:37 hours  Initial lactation visit. Mom is P7, SVD 32hrs ago. Mom has relocated from [place]. She has breastfeeding history, but plans to provide some formula supplementation based on infants size and her feeling that she has decreased supply with her age.  LC reviewed supply and demand, encouraged to minimize the amount of formula given to help build milk supply.  Encouraged enrollment into [facility] program- social worker is assisting with that.   Mom declines pump and pump information, she is apprehensive after watching her sister in law attempt pumping.   Mom and her support family were given information for outpatient information provided.  Maternal Data Does the patient have breastfeeding experience prior to this delivery?: Yes  Feeding Mother's Current Feeding Choice: Breast Milk  LATCH Score                    Lactation Tools Discussed/Used    Interventions Interventions: Education;Breast feeding basics reviewed  Discharge WIC Program:  (social worker is working on enrollment)  Consult Status Consult Status: Complete    Danford Bad 04/30/2021, 4:09 PM

## 2021-04-30 NOTE — Anesthesia Postprocedure Evaluation (Signed)
Anesthesia Post Note  Patient: Candice Walker  Procedure(s) Performed: POST PARTUM TUBAL LIGATION (Bilateral)  Patient location during evaluation: PACU Anesthesia Type: Spinal Level of consciousness: oriented and awake and alert Pain management: pain level controlled Vital Signs Assessment: post-procedure vital signs reviewed and stable Respiratory status: spontaneous breathing, respiratory function stable and nonlabored ventilation Cardiovascular status: blood pressure returned to baseline and stable Postop Assessment: no headache, no backache and spinal receding Anesthetic complications: no   No notable events documented.   Last Vitals:  Vitals:   04/30/21 1345 04/30/21 1424  BP:  125/73  Pulse: 62 60  Resp: 16 18  Temp:  37 C  SpO2: 100% 99%    Last Pain:  Vitals:   04/30/21 1424  TempSrc: Oral  PainSc:                  [name]

## 2021-04-30 NOTE — Anesthesia Procedure Notes (Signed)
Spinal  Patient location during procedure: OR Start time: 04/30/2021 12:31 PM End time: 04/30/2021 12:36 PM Reason for block: surgical anesthesia Staffing Performed: resident/CRNA  Anesthesiologist: Alver Fisher, MD Resident/CRNA: Jeanine Luz, CRNA Preanesthetic Checklist Completed: patient identified, IV checked, site marked, risks and benefits discussed, surgical consent, monitors and equipment checked, pre-op evaluation and timeout performed Spinal Block Patient position: sitting Prep: ChloraPrep Patient monitoring: heart rate, cardiac monitor, continuous pulse ox and blood pressure Approach: midline Location: L3-4 Injection technique: single-shot Needle Needle type: Sprotte  Needle gauge: 24 G Needle length: 9 cm Assessment Sensory level: T4 Events: CSF return

## 2021-04-30 NOTE — Discharge Instructions (Signed)

## 2021-04-30 NOTE — Transfer of Care (Signed)
Immediate Anesthesia Transfer of Care Note  Patient: Candice Walker  Procedure(s) Performed: POST PARTUM TUBAL LIGATION (Bilateral)  Patient Location: PACU  Anesthesia Type:Spinal  Level of Consciousness: awake, alert  and oriented  Airway & Oxygen Therapy: Patient Spontanous Breathing  Post-op Assessment: Report given to RN and Post -op Vital signs reviewed and stable  Post vital signs: Reviewed and stable  Last Vitals:  Vitals Value Taken Time  BP 112/70 04/30/21 1319  Temp    Pulse 63 04/30/21 1321  Resp 18 04/30/21 1321  SpO2 99 % 04/30/21 1321  Vitals shown include unvalidated device data.  Last Pain:  Vitals:   04/30/21 1119  TempSrc: Temporal  PainSc: 2          Complications: No notable events documented.

## 2021-04-30 NOTE — Op Note (Signed)
Candice Walker 04/30/2021  PREOPERATIVE DIAGNOSES: Multiparity, undesired fertility  POSTOPERATIVE DIAGNOSES: Multiparity, undesired fertility  PROCEDURE:  Postpartum Bilateral Tubal Sterilization by bilateral partial salpingectomy, including fimbriae  SURGEON: Dr. Christeen Douglas  ANESTHESIA: Spinal and local analgesia using 15 ml of 0.5% Marcaine  Anesthesiologist: Alver Fisher, MD Anesthesiologist: Alver Fisher, MD CRNA: Jeanine Luz, CRNA  COMPLICATIONS:  None immediate.  ESTIMATED BLOOD LOSS: minimal.  FLUIDS: .  URINE OUTPUT:  Not assessed  INDICATIONS:  35 y.o. G7P6 with undesired fertility,status post vaginal delivery, desires permanent sterilization.  Other reversible forms of contraception were discussed with patient; she declines all other modalities. Risks of procedure discussed with patient including but not limited to: risk of regret, permanence of method, bleeding, infection, injury to surrounding organs and need for additional procedures.  Failure risk of 1 -2 % with increased risk of ectopic gestation if pregnancy occurs was also discussed with patient.      FINDINGS:  Normal uterus, tubes, and ovaries.  PROCEDURE DETAILS:  The patient was taken to the operating room where her epidural anesthesia was dosed up to surgical level and found to be adequate.  She was then placed in the dorsal supine position and prepped and draped in sterile fashion.  After an adequate timeout was performed, attention was turned to the patient's abdomen where a small transverse skin incision was made under the umbilical fold. The incision was taken down to the layer of fascia using the scalpel, and fascia was incised, and extended bilaterally using Mayo scissors. The peritoneum was entered in a sharp fashion. Attention was then turned to the patient's uterus, and left fallopian tube was identified and followed out to the fimbriated end. Using a ENSEAL bipolar cautery device, a  portion of the left tube including the fimbriated end was cauterized and sharply excised.  A similar process was carried out on the right side allowing for bilateral tubal sterilization.  Good hemostasis was noted overall.  Local analgesia was poured over both adenexa.The instruments were then removed from the patient's abdomen and the fascial incision was repaired with 0 Vicryl, and the skin was closed with a 4-0 Vicryl subcuticular stitch. The patient tolerated the procedure well.  Instrument, sponge, and needle counts were correct times two.  The patient was then taken to the recovery room awake and in stable condition.

## 2021-04-30 NOTE — Anesthesia Preprocedure Evaluation (Signed)
Anesthesia Evaluation  Patient identified by MRN, date of birth, ID band Patient awake    Reviewed: Allergy & Precautions, NPO status , Patient's Chart, lab work & pertinent test results  History of Anesthesia Complications Negative for: history of anesthetic complications  Airway Mallampati: II  TM Distance: >3 FB Neck ROM: Full    Dental no notable dental hx.    Pulmonary neg pulmonary ROS, neg sleep apnea, neg COPD,    breath sounds clear to auscultation- rhonchi (-) wheezing      Cardiovascular Exercise Tolerance: Good hypertension, Pt. on medications (-) CAD, (-) Past MI, (-) Cardiac Stents and (-) CABG  Rhythm:Regular Rate:Normal - Systolic murmurs and - Diastolic murmurs    Neuro/Psych neg Seizures negative neurological ROS  negative psych ROS   GI/Hepatic Neg liver ROS, GERD  ,  Endo/Other  diabetes  Renal/GU negative Renal ROS     Musculoskeletal negative musculoskeletal ROS (+)   Abdominal (+) - obese,   Peds  Hematology  (+) anemia ,   Anesthesia Other Findings Past Medical History: No date: Anemia No date: Beta thalassemia (HCC) No date: GERD (gastroesophageal reflux disease) No date: Gestational diabetes No date: Hypertension   Reproductive/Obstetrics                             Lab Results  Component Value Date   WBC 10.6 (H) 04/30/2021   HGB 9.0 (L) 04/30/2021   HCT 27.8 (L) 04/30/2021   MCV 63.2 (L) 04/30/2021   PLT 182 04/30/2021    Anesthesia Physical Anesthesia Plan  ASA: 2  Anesthesia Plan: Spinal   Post-op Pain Management:    Induction:   PONV Risk Score and Plan: 2 and Ondansetron and Midazolam  Airway Management Planned: Natural Airway  Additional Equipment:   Intra-op Plan:   Post-operative Plan:   Informed Consent: I have reviewed the patients History and Physical, chart, labs and discussed the procedure including the risks, benefits  and alternatives for the proposed anesthesia with the patient or authorized representative who has indicated his/her understanding and acceptance.     Dental advisory given  Plan Discussed with: CRNA and Anesthesiologist  Anesthesia Plan Comments:         Anesthesia Quick Evaluation

## 2021-05-01 ENCOUNTER — Ambulatory Visit: Payer: Medicaid Other

## 2021-05-01 ENCOUNTER — Encounter: Payer: Self-pay | Admitting: Obstetrics and Gynecology

## 2021-05-01 LAB — SURGICAL PATHOLOGY

## 2021-05-02 NOTE — Progress Notes (Signed)
Entered in error

## 2021-05-05 DIAGNOSIS — Z419 Encounter for procedure for purposes other than remedying health state, unspecified: Secondary | ICD-10-CM | POA: Diagnosis not present

## 2021-06-05 DIAGNOSIS — Z419 Encounter for procedure for purposes other than remedying health state, unspecified: Secondary | ICD-10-CM | POA: Diagnosis not present

## 2021-06-13 DIAGNOSIS — R5383 Other fatigue: Secondary | ICD-10-CM | POA: Diagnosis not present

## 2021-06-13 DIAGNOSIS — R531 Weakness: Secondary | ICD-10-CM | POA: Diagnosis not present

## 2021-06-13 DIAGNOSIS — G43109 Migraine with aura, not intractable, without status migrainosus: Secondary | ICD-10-CM | POA: Diagnosis not present

## 2021-06-13 DIAGNOSIS — R42 Dizziness and giddiness: Secondary | ICD-10-CM | POA: Diagnosis not present

## 2021-06-15 ENCOUNTER — Other Ambulatory Visit: Payer: Self-pay | Admitting: *Deleted

## 2021-06-15 DIAGNOSIS — D509 Iron deficiency anemia, unspecified: Secondary | ICD-10-CM

## 2021-06-15 DIAGNOSIS — D561 Beta thalassemia: Secondary | ICD-10-CM

## 2021-06-21 DIAGNOSIS — D561 Beta thalassemia: Secondary | ICD-10-CM | POA: Diagnosis not present

## 2021-06-21 DIAGNOSIS — R42 Dizziness and giddiness: Secondary | ICD-10-CM | POA: Diagnosis not present

## 2021-06-21 DIAGNOSIS — R55 Syncope and collapse: Secondary | ICD-10-CM | POA: Diagnosis not present

## 2021-06-21 DIAGNOSIS — O99012 Anemia complicating pregnancy, second trimester: Secondary | ICD-10-CM | POA: Diagnosis not present

## 2021-06-25 ENCOUNTER — Inpatient Hospital Stay: Payer: Medicaid Other | Attending: Internal Medicine

## 2021-06-25 ENCOUNTER — Encounter: Payer: Self-pay | Admitting: Internal Medicine

## 2021-06-25 ENCOUNTER — Other Ambulatory Visit: Payer: Self-pay

## 2021-06-25 ENCOUNTER — Inpatient Hospital Stay (HOSPITAL_BASED_OUTPATIENT_CLINIC_OR_DEPARTMENT_OTHER): Payer: Medicaid Other | Admitting: Internal Medicine

## 2021-06-25 DIAGNOSIS — D509 Iron deficiency anemia, unspecified: Secondary | ICD-10-CM | POA: Insufficient documentation

## 2021-06-25 DIAGNOSIS — D561 Beta thalassemia: Secondary | ICD-10-CM | POA: Insufficient documentation

## 2021-06-25 DIAGNOSIS — I10 Essential (primary) hypertension: Secondary | ICD-10-CM | POA: Insufficient documentation

## 2021-06-25 DIAGNOSIS — D649 Anemia, unspecified: Secondary | ICD-10-CM

## 2021-06-25 DIAGNOSIS — O99019 Anemia complicating pregnancy, unspecified trimester: Secondary | ICD-10-CM

## 2021-06-25 LAB — CBC WITH DIFFERENTIAL/PLATELET
Abs Immature Granulocytes: 0.08 10*3/uL — ABNORMAL HIGH (ref 0.00–0.07)
Basophils Absolute: 0.1 10*3/uL (ref 0.0–0.1)
Basophils Relative: 1 %
Eosinophils Absolute: 0.2 10*3/uL (ref 0.0–0.5)
Eosinophils Relative: 3 %
HCT: 32.5 % — ABNORMAL LOW (ref 36.0–46.0)
Hemoglobin: 10.2 g/dL — ABNORMAL LOW (ref 12.0–15.0)
Immature Granulocytes: 1 %
Lymphocytes Relative: 38 %
Lymphs Abs: 2.8 10*3/uL (ref 0.7–4.0)
MCH: 19.1 pg — ABNORMAL LOW (ref 26.0–34.0)
MCHC: 31.4 g/dL (ref 30.0–36.0)
MCV: 60.9 fL — ABNORMAL LOW (ref 80.0–100.0)
Monocytes Absolute: 0.5 10*3/uL (ref 0.1–1.0)
Monocytes Relative: 7 %
Neutro Abs: 3.7 10*3/uL (ref 1.7–7.7)
Neutrophils Relative %: 50 %
Platelets: 257 10*3/uL (ref 150–400)
RBC: 5.34 MIL/uL — ABNORMAL HIGH (ref 3.87–5.11)
RDW: 15.6 % — ABNORMAL HIGH (ref 11.5–15.5)
WBC: 7.3 10*3/uL (ref 4.0–10.5)
nRBC: 0 % (ref 0.0–0.2)

## 2021-06-25 LAB — IRON AND TIBC
Iron: 150 ug/dL (ref 28–170)
Saturation Ratios: 46 % — ABNORMAL HIGH (ref 10.4–31.8)
TIBC: 323 ug/dL (ref 250–450)
UIBC: 173 ug/dL

## 2021-06-25 LAB — FERRITIN: Ferritin: 135 ng/mL (ref 11–307)

## 2021-06-25 NOTE — Progress Notes (Signed)
[facility] CONSULT NOTE  Patient Care Team: Pcp, No as PCP - General  CHIEF COMPLAINTS/PURPOSE OF CONSULTATION: Iron deficient anemia  HISTORY OF PRESENTING ILLNESS: Ambulating independently.  Accompanied by interpreter/Host.  Candice Walker 35 y.o.  female patient from [place]-with chronic anemia/thalassemia-iron deficiency for pregnancy is here for follow-up.  Patient received IV iron infusion prior to her delivery.  Patient delivered her baby end of September 2022.  Postpartum no complications.  Patient admits to episodes of syncope for which she has been evaluated by cardiology recently.  She is currently wearing a monitor.    Review of Systems  Constitutional:  Positive for malaise/fatigue. Negative for chills, diaphoresis, fever and weight loss.  HENT:  Negative for nosebleeds and sore throat.   Eyes:  Negative for double vision.  Respiratory:  Negative for cough, hemoptysis, sputum production, shortness of breath and wheezing.   Cardiovascular:  Negative for chest pain, palpitations, orthopnea and leg swelling.  Gastrointestinal:  Negative for abdominal pain, blood in stool, constipation, diarrhea, heartburn, melena, nausea and vomiting.  Genitourinary:  Negative for dysuria, frequency and urgency.  Musculoskeletal:  Negative for back pain and joint pain.  Skin: Negative.  Negative for itching and rash.  Neurological:  Negative for dizziness, tingling, focal weakness, weakness and headaches.  Endo/Heme/Allergies:  Does not bruise/bleed easily.  Psychiatric/Behavioral:  Negative for depression. The patient is not nervous/anxious and does not have insomnia.     MEDICAL HISTORY:  Past Medical History:  Diagnosis Date   Anemia    Beta thalassemia (HCC)    GERD (gastroesophageal reflux disease)    Gestational diabetes    Hypertension     SURGICAL HISTORY: Past Surgical History:  Procedure Laterality Date   TUBAL LIGATION Bilateral 04/30/2021    Procedure: POST PARTUM TUBAL LIGATION;  Surgeon: Christeen Douglas, MD;  Location: [facility] ORS;  Service: Gynecology;  Laterality: Bilateral;    SOCIAL HISTORY: Social History   Socioeconomic History   Marital status: Married    Spouse name: Yama   Number of children: 6   Years of education: Not on file   Highest education level: Not on file  Occupational History   Not on file  Tobacco Use   Smoking status: Never    Passive exposure: Never   Smokeless tobacco: Never  Vaping Use   Vaping Use: Never used  Substance and Sexual Activity   Alcohol use: Never   Drug use: Never   Sexual activity: Yes    Birth control/protection: None    Comment: planning BTL  Other Topics Concern   Not on file  Social History Narrative   Patient previously lived in [place] and left as refugee. Resided in refugee camp in [place], then in [place] and now lives in [place] with a host family along with her husband and children.    Social Determinants of Health   Financial Resource Strain: Not on file  Food Insecurity: Not on file  Transportation Needs: Not on file  Physical Activity: Not on file  Stress: Not on file  Social Connections: Not on file  Intimate Partner Violence: Not on file    FAMILY HISTORY: Family History  Problem Relation Age of Onset   Hypertension Mother    Diabetes Father    Diabetes Paternal Grandmother     ALLERGIES:  has No Known Allergies.  MEDICATIONS:  Current Outpatient Medications  Medication Sig Dispense Refill   labetalol (NORMODYNE) 100 MG tablet Take 1 tablet (100 mg total) by mouth  2 (two) times daily. 60 tablet 0   omeprazole (PRILOSEC) 40 MG capsule Take 1 tablet by mouth daily.     Prenatal Vit-Fe Fumarate-FA (PRENATAL MULTIVITAMIN) TABS tablet Take 1 tablet by mouth daily at 12 noon.     senna-docusate (SENOKOT-S) 8.6-50 MG tablet Take 2 tablets by mouth daily. 30 tablet 0   acetaminophen (TYLENOL) 325 MG tablet Take 2 tablets (650 mg total) by mouth  every 4 (four) hours as needed (for pain scale < 4). (Patient not taking: Reported on 06/25/2021)     benzocaine-Menthol (DERMOPLAST) 20-0.5 % AERO Apply 1 application topically as needed for irritation (perineal discomfort). (Patient not taking: Reported on 06/25/2021)     dibucaine (NUPERCAINAL) 1 % OINT Place 1 application rectally as needed for hemorrhoids (if tucks not working). (Patient not taking: Reported on 06/25/2021)     ibuprofen (ADVIL) 600 MG tablet Take 1 tablet (600 mg total) by mouth every 6 (six) hours. (Patient not taking: Reported on 06/25/2021) 30 tablet 0   witch hazel-glycerin (TUCKS) pad Apply 1 application topically as needed for hemorrhoids (for pain). (Patient not taking: Reported on 06/25/2021) 40 each 12   No current facility-administered medications for this visit.      Marland Kitchen  PHYSICAL EXAMINATION:  Vitals:   06/25/21 1311  BP: 137/89  Pulse: 79  Resp: 18  Temp: 99 F (37.2 C)   Filed Weights   06/25/21 1311  Weight: 142 lb 6.4 oz (64.6 kg)    Physical Exam Vitals and nursing note reviewed.  HENT:     Head: Normocephalic and atraumatic.     Mouth/Throat:     Pharynx: Oropharynx is clear.  Eyes:     Extraocular Movements: Extraocular movements intact.     Pupils: Pupils are equal, round, and reactive to light.  Cardiovascular:     Rate and Rhythm: Normal rate and regular rhythm.  Pulmonary:     Comments: Decreased breath sounds bilaterally.  Abdominal:     Palpations: Abdomen is soft.  Musculoskeletal:        General: Normal range of motion.     Cervical back: Normal range of motion.  Skin:    General: Skin is warm.  Neurological:     General: No focal deficit present.     Mental Status: She is alert and oriented to person, place, and time.  Psychiatric:        Behavior: Behavior normal.        Judgment: Judgment normal.    LABORATORY DATA:  I have reviewed the data as listed Lab Results  Component Value Date   WBC 7.3 06/25/2021    HGB 10.2 (L) 06/25/2021   HCT 32.5 (L) 06/25/2021   MCV 60.9 (L) 06/25/2021   PLT 257 06/25/2021   Recent Labs    02/01/21 1221 04/28/21 0958  NA 131* 136  K 3.3* 3.5  CL 105 106  CO2 20* 22  GLUCOSE 126* 76  BUN 11 8  CREATININE 0.52 0.60  CALCIUM 8.6* 8.4*  GFRNONAA >60 >60  PROT 6.8 6.2*  ALBUMIN 3.4* 3.1*  AST 17 19  ALT 10 11  ALKPHOS 57 141*  BILITOT 0.6 0.8    RADIOGRAPHIC STUDIES: I have personally reviewed the radiological images as listed and agreed with the findings in the report. No results found.  Symptomatic anemia #Symptomatic anemia-iron deficiency s/p IV iron infusion- Hb improved at 10.4. recommend Oral iron [hx of constipation- ]; recommend Iron biglyicnate one a day.   #  Etiology likely: Pregnancy/thalassemia- post delivery in oct 2022.  However history of heavy menstrual cycles-might need more IV iron infusion in the future.  # Syncope: Unclear etiology unlikely related to her anemia.  Recommend continued evaluation with cardiology.  # DISPOSITION: # in 4 months- follow up with NP- labs-cbc/bmp- possible venofer-Dr.B    All questions were answered. The patient knows to call the clinic with any problems, questions or concerns.       Earna Coder, MD 06/25/2021 1:54 PM

## 2021-06-25 NOTE — Assessment & Plan Note (Addendum)
#  Symptomatic anemia-iron deficiency s/p IV iron infusion- Hb improved at 10.4. recommend Oral iron [hx of constipation- ]; recommend Iron biglyicnate one a day.   #Etiology likely: Pregnancy/thalassemia- post delivery in oct 2022.  However history of heavy menstrual cycles-might need more IV iron infusion in the future.  # Syncope: Unclear etiology unlikely related to her anemia.  Recommend continued evaluation with cardiology.  # DISPOSITION: # in 4 months- follow up with NP- labs-cbc/bmp- possible venofer-Dr.B

## 2021-06-25 NOTE — Progress Notes (Signed)
Pt here for follow up. Pt reports that she was having dizziness and was seen by cardiology. Pt reports that she has sharp pain that comes and goes, to left flank, especially when she walks more. Pt has low energy

## 2021-07-05 DIAGNOSIS — Z419 Encounter for procedure for purposes other than remedying health state, unspecified: Secondary | ICD-10-CM | POA: Diagnosis not present

## 2021-07-09 DIAGNOSIS — R55 Syncope and collapse: Secondary | ICD-10-CM | POA: Diagnosis not present

## 2021-07-09 DIAGNOSIS — R42 Dizziness and giddiness: Secondary | ICD-10-CM | POA: Diagnosis not present

## 2021-08-05 DIAGNOSIS — Z419 Encounter for procedure for purposes other than remedying health state, unspecified: Secondary | ICD-10-CM | POA: Diagnosis not present

## 2021-08-08 DIAGNOSIS — R42 Dizziness and giddiness: Secondary | ICD-10-CM | POA: Diagnosis not present

## 2021-08-08 DIAGNOSIS — F4322 Adjustment disorder with anxiety: Secondary | ICD-10-CM | POA: Diagnosis not present

## 2021-08-11 DIAGNOSIS — Z23 Encounter for immunization: Secondary | ICD-10-CM | POA: Diagnosis not present

## 2021-08-11 DIAGNOSIS — I10 Essential (primary) hypertension: Secondary | ICD-10-CM | POA: Diagnosis not present

## 2021-09-01 DIAGNOSIS — F4312 Post-traumatic stress disorder, chronic: Secondary | ICD-10-CM | POA: Diagnosis not present

## 2021-09-01 DIAGNOSIS — F4322 Adjustment disorder with anxiety: Secondary | ICD-10-CM | POA: Diagnosis not present

## 2021-09-05 DIAGNOSIS — Z419 Encounter for procedure for purposes other than remedying health state, unspecified: Secondary | ICD-10-CM | POA: Diagnosis not present

## 2021-09-07 DIAGNOSIS — F411 Generalized anxiety disorder: Secondary | ICD-10-CM | POA: Diagnosis not present

## 2021-09-10 DIAGNOSIS — R002 Palpitations: Secondary | ICD-10-CM | POA: Diagnosis not present

## 2021-09-10 DIAGNOSIS — R42 Dizziness and giddiness: Secondary | ICD-10-CM | POA: Diagnosis not present

## 2021-09-10 DIAGNOSIS — I10 Essential (primary) hypertension: Secondary | ICD-10-CM | POA: Diagnosis not present

## 2021-09-14 DIAGNOSIS — F4321 Adjustment disorder with depressed mood: Secondary | ICD-10-CM | POA: Diagnosis not present

## 2021-09-21 DIAGNOSIS — F411 Generalized anxiety disorder: Secondary | ICD-10-CM | POA: Diagnosis not present

## 2021-09-28 DIAGNOSIS — F411 Generalized anxiety disorder: Secondary | ICD-10-CM | POA: Diagnosis not present

## 2021-10-03 DIAGNOSIS — Z419 Encounter for procedure for purposes other than remedying health state, unspecified: Secondary | ICD-10-CM | POA: Diagnosis not present

## 2021-10-05 DIAGNOSIS — F4311 Post-traumatic stress disorder, acute: Secondary | ICD-10-CM | POA: Diagnosis not present

## 2021-10-05 DIAGNOSIS — F411 Generalized anxiety disorder: Secondary | ICD-10-CM | POA: Diagnosis not present

## 2021-10-08 ENCOUNTER — Encounter: Payer: Self-pay | Admitting: Nurse Practitioner

## 2021-10-12 DIAGNOSIS — F411 Generalized anxiety disorder: Secondary | ICD-10-CM | POA: Diagnosis not present

## 2021-10-19 DIAGNOSIS — F411 Generalized anxiety disorder: Secondary | ICD-10-CM | POA: Diagnosis not present

## 2021-10-21 ENCOUNTER — Other Ambulatory Visit: Payer: Self-pay | Admitting: Internal Medicine

## 2021-10-22 ENCOUNTER — Other Ambulatory Visit: Payer: Self-pay

## 2021-10-22 ENCOUNTER — Encounter: Payer: Self-pay | Admitting: Nurse Practitioner

## 2021-10-22 ENCOUNTER — Inpatient Hospital Stay: Payer: Medicaid Other | Attending: Nurse Practitioner

## 2021-10-22 ENCOUNTER — Inpatient Hospital Stay: Payer: Medicaid Other

## 2021-10-22 ENCOUNTER — Inpatient Hospital Stay (HOSPITAL_BASED_OUTPATIENT_CLINIC_OR_DEPARTMENT_OTHER): Payer: Medicaid Other | Admitting: Nurse Practitioner

## 2021-10-22 VITALS — BP 133/91 | HR 80 | Temp 99.1°F | Ht 60.0 in | Wt 146.4 lb

## 2021-10-22 DIAGNOSIS — R55 Syncope and collapse: Secondary | ICD-10-CM | POA: Insufficient documentation

## 2021-10-22 DIAGNOSIS — D561 Beta thalassemia: Secondary | ICD-10-CM

## 2021-10-22 DIAGNOSIS — D649 Anemia, unspecified: Secondary | ICD-10-CM

## 2021-10-22 DIAGNOSIS — D509 Iron deficiency anemia, unspecified: Secondary | ICD-10-CM | POA: Diagnosis not present

## 2021-10-22 LAB — CBC WITH DIFFERENTIAL/PLATELET
Abs Immature Granulocytes: 0.08 10*3/uL — ABNORMAL HIGH (ref 0.00–0.07)
Basophils Absolute: 0.1 10*3/uL (ref 0.0–0.1)
Basophils Relative: 1 %
Eosinophils Absolute: 0.1 10*3/uL (ref 0.0–0.5)
Eosinophils Relative: 2 %
HCT: 34.8 % — ABNORMAL LOW (ref 36.0–46.0)
Hemoglobin: 10.8 g/dL — ABNORMAL LOW (ref 12.0–15.0)
Immature Granulocytes: 1 %
Lymphocytes Relative: 31 %
Lymphs Abs: 2.4 10*3/uL (ref 0.7–4.0)
MCH: 18.8 pg — ABNORMAL LOW (ref 26.0–34.0)
MCHC: 31 g/dL (ref 30.0–36.0)
MCV: 60.5 fL — ABNORMAL LOW (ref 80.0–100.0)
Monocytes Absolute: 0.6 10*3/uL (ref 0.1–1.0)
Monocytes Relative: 7 %
Neutro Abs: 4.6 10*3/uL (ref 1.7–7.7)
Neutrophils Relative %: 58 %
Platelets: 247 10*3/uL (ref 150–400)
RBC: 5.75 MIL/uL — ABNORMAL HIGH (ref 3.87–5.11)
RDW: 16.1 % — ABNORMAL HIGH (ref 11.5–15.5)
WBC: 7.9 10*3/uL (ref 4.0–10.5)
nRBC: 0 % (ref 0.0–0.2)

## 2021-10-22 LAB — BASIC METABOLIC PANEL
Anion gap: 7 (ref 5–15)
BUN: 14 mg/dL (ref 6–20)
CO2: 23 mmol/L (ref 22–32)
Calcium: 8.6 mg/dL — ABNORMAL LOW (ref 8.9–10.3)
Chloride: 106 mmol/L (ref 98–111)
Creatinine, Ser: 0.51 mg/dL (ref 0.44–1.00)
GFR, Estimated: >60 mL/min (ref >60)
Glucose, Bld: 103 mg/dL — ABNORMAL HIGH (ref 70–99)
Potassium: 3.9 mmol/L (ref 3.5–5.1)
Sodium: 136 mmol/L (ref 135–145)

## 2021-10-22 LAB — IRON AND TIBC
Iron: 80 ug/dL (ref 28–170)
Saturation Ratios: 23 % (ref 10.4–31.8)
TIBC: 350 ug/dL (ref 250–450)
UIBC: 270 ug/dL

## 2021-10-22 LAB — FERRITIN: Ferritin: 126 ng/mL (ref 11–307)

## 2021-10-22 NOTE — Progress Notes (Signed)
[name] ?CONSULT NOTE ? ?Patient Care Team: ?Pcp, No as PCP - General ? ?CHIEF COMPLAINTS/PURPOSE OF CONSULTATION: Iron deficient anemia ? ?HISTORY OF PRESENTING ILLNESS: Ambulating independently.  Accompanied by Host.  ?Candice Walker 36 y.o.  female patient from [place]-with chronic anemia/thalassemia-iron deficiency for pregnancy is here for follow-up. She is breast feeding and menses has not returned. Concerned that her tubal ligation has not healed given some ongoing back pain. She delivered her baby end of September 2022 and received IV iron prior to delivery. Patient admits to episodes of syncope and is followed by cardiology. Possibly vagal in etiology. On omeprazole and labetalol now. Symptoms have improved.  ?  ? ?Review of Systems  ?Constitutional:  Positive for malaise/fatigue. Negative for chills, diaphoresis, fever and weight loss.  ?HENT:  Negative for nosebleeds and sore throat.   ?Eyes:  Negative for double vision.  ?Respiratory:  Negative for cough, hemoptysis, sputum production, shortness of breath and wheezing.   ?Cardiovascular:  Negative for chest pain, palpitations, orthopnea and leg swelling.  ?Gastrointestinal:  Negative for abdominal pain, blood in stool, constipation, diarrhea, heartburn, melena, nausea and vomiting.  ?Genitourinary:  Negative for dysuria, frequency and urgency.  ?Musculoskeletal:  Negative for back pain and joint pain.  ?Skin: Negative.  Negative for itching and rash.  ?Neurological:  Negative for dizziness, tingling, focal weakness, weakness and headaches.  ?Endo/Heme/Allergies:  Does not bruise/bleed easily.  ?Psychiatric/Behavioral:  Negative for depression. The patient is not nervous/anxious and does not have insomnia.    ? ?MEDICAL HISTORY:  ?Past Medical History:  ?Diagnosis Date  ? Anemia   ? Beta thalassemia (HCC)   ? GERD (gastroesophageal reflux disease)   ? Gestational diabetes   ? Hypertension   ? ? ?SURGICAL HISTORY: ?Past Surgical  History:  ?Procedure Laterality Date  ? TUBAL LIGATION Bilateral 04/30/2021  ? Procedure: POST PARTUM TUBAL LIGATION;  Surgeon: Christeen DouglasBeasley, Bethany, MD;  Location: [facility] ORS;  Service: Gynecology;  Laterality: Bilateral;  ? ? ?SOCIAL HISTORY: ?Social History  ? ?Socioeconomic History  ? Marital status: Married  ?  Spouse name: Yama  ? Number of children: 6  ? Years of education: Not on file  ? Highest education level: Not on file  ?Occupational History  ? Not on file  ?Tobacco Use  ? Smoking status: Never  ?  Passive exposure: Never  ? Smokeless tobacco: Never  ?Vaping Use  ? Vaping Use: Never used  ?Substance and Sexual Activity  ? Alcohol use: Never  ? Drug use: Never  ? Sexual activity: Yes  ?  Birth control/protection: None  ?  Comment: planning BTL  ?Other Topics Concern  ? Not on file  ?Social History Narrative  ? Patient previously lived in [place] and left as refugee. Resided in refugee camp in [place], then in [place] and now lives in [place] with a host family along with her husband and children.   ? ?Social Determinants of Health  ? ?Financial Resource Strain: Not on file  ?Food Insecurity: Not on file  ?Transportation Needs: Not on file  ?Physical Activity: Not on file  ?Stress: Not on file  ?Social Connections: Not on file  ?Intimate Partner Violence: Not on file  ? ? ?FAMILY HISTORY: ?Family History  ?Problem Relation Age of Onset  ? Hypertension Mother   ? Diabetes Father   ? Diabetes Paternal Grandmother   ? ? ?ALLERGIES:  has No Known Allergies. ? ?MEDICATIONS:  ?Current Outpatient Medications  ?Medication Sig Dispense Refill  ?  labetalol (NORMODYNE) 100 MG tablet Take 1 tablet (100 mg total) by mouth 2 (two) times daily. 60 tablet 0  ? omeprazole (PRILOSEC) 40 MG capsule Take 1 tablet by mouth daily.    ? Prenatal Vit-Fe Fumarate-FA (PRENATAL MULTIVITAMIN) TABS tablet Take 1 tablet by mouth daily at 12 noon.    ? senna-docusate (SENOKOT-S) 8.6-50 MG tablet Take 2 tablets by mouth daily. 30 tablet 0  ?  acetaminophen (TYLENOL) 325 MG tablet Take 2 tablets (650 mg total) by mouth every 4 (four) hours as needed (for pain scale < 4). (Patient not taking: Reported on 06/25/2021)    ? benzocaine-Menthol (DERMOPLAST) 20-0.5 % AERO Apply 1 application topically as needed for irritation (perineal discomfort). (Patient not taking: Reported on 06/25/2021)    ? dibucaine (NUPERCAINAL) 1 % OINT Place 1 application rectally as needed for hemorrhoids (if tucks not working). (Patient not taking: Reported on 06/25/2021)    ? ibuprofen (ADVIL) 600 MG tablet Take 1 tablet (600 mg total) by mouth every 6 (six) hours. (Patient not taking: Reported on 06/25/2021) 30 tablet 0  ? witch hazel-glycerin (TUCKS) pad Apply 1 application topically as needed for hemorrhoids (for pain). (Patient not taking: Reported on 06/25/2021) 40 each 12  ? ?No current facility-administered medications for this visit.  ? ?. ? ?PHYSICAL EXAMINATION: ?Vitals:  ? 10/22/21 1352  ?BP: (!) 133/91  ?Pulse: 80  ?Temp: 99.1 ?F (37.3 ?C)  ?SpO2: 100%  ? ? ?Filed Weights  ? 10/22/21 1352  ?Weight: 146 lb 6.4 oz (66.4 kg)  ? ? ?Physical Exam ?Vitals and nursing note reviewed.  ?HENT:  ?   Head: Normocephalic and atraumatic.  ?   Mouth/Throat:  ?   Pharynx: Oropharynx is clear.  ?Eyes:  ?   Extraocular Movements: Extraocular movements intact.  ?   Pupils: Pupils are equal, round, and reactive to light.  ?Cardiovascular:  ?   Rate and Rhythm: Normal rate and regular rhythm.  ?Pulmonary:  ?   Comments: Decreased breath sounds bilaterally.  ?Abdominal:  ?   Palpations: Abdomen is soft.  ?Musculoskeletal:     ?   General: Normal range of motion.  ?   Cervical back: Normal range of motion.  ?Skin: ?   General: Skin is warm.  ?Neurological:  ?   General: No focal deficit present.  ?   Mental Status: She is alert and oriented to person, place, and time.  ?Psychiatric:     ?   Behavior: Behavior normal.     ?   Judgment: Judgment normal.  ? ? ?LABORATORY DATA:  ?I have reviewed  the data as listed ?Lab Results  ?Component Value Date  ? WBC 7.9 10/22/2021  ? HGB 10.8 (L) 10/22/2021  ? HCT 34.8 (L) 10/22/2021  ? MCV 60.5 (L) 10/22/2021  ? PLT 247 10/22/2021  ? ?Recent Labs  ?  02/01/21 ?1221 04/28/21 ?0958 10/22/21 ?1314  ?NA 131* 136 136  ?K 3.3* 3.5 3.9  ?CL 105 106 106  ?CO2 20* 22 23  ?GLUCOSE 126* 76 103*  ?BUN 11 8 14   ?CREATININE 0.52 0.60 0.51  ?CALCIUM 8.6* 8.4* 8.6*  ?GFRNONAA >60 >60 >60  ?PROT 6.8 6.2*  --   ?ALBUMIN 3.4* 3.1*  --   ?AST 17 19  --   ?ALT 10 11  --   ?ALKPHOS 57 141*  --   ?BILITOT 0.6 0.8  --   ? ?Iron/TIBC/Ferritin/ %Sat ?   ?Component Value Date/Time  ? IRON 150  06/25/2021 1244  ? TIBC 323 06/25/2021 1244  ? FERRITIN 135 06/25/2021 1244  ? IRONPCTSAT 46 (H) 06/25/2021 1244  ? ? ? ?RADIOGRAPHIC STUDIES: ?I have personally reviewed the radiological images as listed and agreed with the findings in the report. ?No results found. ? ?No problem-specific Assessment & Plan notes found for this encounter. ? ?#Symptomatic anemia-iron deficiency s/p IV iron infusion- Hb improved at 10.8. Continue oral iron bisglyicnate once a day. Iron studies pending at time of visit.  Ferritin 126. Iron sat 23%. Hold iv iron.  ? ?# Heavy periods- not currently menstruating (breastfeeding). May require iv iron in the future.  ? ?# Thalassemia- hemoglobin stable at 10.8.  ?  ?# hx of tubal ligation- recommend she follow up with gyn for evaluation of back pain/post op pain.  ?  ?# Syncope: Unclear etiology unlikely related to her anemia. Symptom control improved with cardiology.  ?  ?# DISPOSITION: ?No iron today  ?3 mo- lab (cbc, cmp, ferritin, iron studies) ?Day to week later see Dr Donneta Romberg, +/- venofer.  ?Interpreter requested for appointments.  ? ?Due to a language barrier, a virtual interpreter was utilized for all patient interactions. ? ?All questions were answered. The patient knows to call the clinic with any problems, questions or concerns. ?  ?Alinda Dooms, NP ?10/22/2021   ? ? ? ? ? ?

## 2021-10-22 NOTE — Progress Notes (Signed)
C/o blood pressure has been high. Feels dizzy when bp goes up. Feeling numbness/tingling on part of the body. ? ?Has feelings of anger. ? ? ?

## 2021-10-26 DIAGNOSIS — F411 Generalized anxiety disorder: Secondary | ICD-10-CM | POA: Diagnosis not present

## 2021-11-02 DIAGNOSIS — F411 Generalized anxiety disorder: Secondary | ICD-10-CM | POA: Diagnosis not present

## 2021-11-03 DIAGNOSIS — Z419 Encounter for procedure for purposes other than remedying health state, unspecified: Secondary | ICD-10-CM | POA: Diagnosis not present

## 2021-11-09 DIAGNOSIS — F411 Generalized anxiety disorder: Secondary | ICD-10-CM | POA: Diagnosis not present

## 2021-11-10 DIAGNOSIS — R140 Abdominal distension (gaseous): Secondary | ICD-10-CM | POA: Diagnosis not present

## 2021-11-10 DIAGNOSIS — R109 Unspecified abdominal pain: Secondary | ICD-10-CM | POA: Diagnosis not present

## 2021-11-10 DIAGNOSIS — M549 Dorsalgia, unspecified: Secondary | ICD-10-CM | POA: Diagnosis not present

## 2021-11-10 DIAGNOSIS — R519 Headache, unspecified: Secondary | ICD-10-CM | POA: Diagnosis not present

## 2021-11-16 ENCOUNTER — Other Ambulatory Visit: Payer: Self-pay

## 2021-11-16 ENCOUNTER — Encounter: Payer: Self-pay | Admitting: Nurse Practitioner

## 2021-11-16 ENCOUNTER — Ambulatory Visit: Payer: Medicaid Other | Admitting: Nurse Practitioner

## 2021-11-16 VITALS — BP 140/88 | HR 94 | Temp 98.8°F | Resp 16 | Ht 60.5 in | Wt 141.9 lb

## 2021-11-16 DIAGNOSIS — D509 Iron deficiency anemia, unspecified: Secondary | ICD-10-CM

## 2021-11-16 DIAGNOSIS — K219 Gastro-esophageal reflux disease without esophagitis: Secondary | ICD-10-CM

## 2021-11-16 DIAGNOSIS — O99019 Anemia complicating pregnancy, unspecified trimester: Secondary | ICD-10-CM | POA: Diagnosis not present

## 2021-11-16 DIAGNOSIS — K5903 Drug induced constipation: Secondary | ICD-10-CM

## 2021-11-16 DIAGNOSIS — F411 Generalized anxiety disorder: Secondary | ICD-10-CM | POA: Diagnosis not present

## 2021-11-16 DIAGNOSIS — F330 Major depressive disorder, recurrent, mild: Secondary | ICD-10-CM

## 2021-11-16 DIAGNOSIS — I10 Essential (primary) hypertension: Secondary | ICD-10-CM | POA: Diagnosis not present

## 2021-11-16 DIAGNOSIS — N941 Unspecified dyspareunia: Secondary | ICD-10-CM | POA: Diagnosis not present

## 2021-11-16 DIAGNOSIS — D561 Beta thalassemia: Secondary | ICD-10-CM | POA: Diagnosis not present

## 2021-11-16 MED ORDER — HYDROCHLOROTHIAZIDE 12.5 MG PO TABS
12.5000 mg | ORAL_TABLET | Freq: Every day | ORAL | 1 refills | Status: DC
Start: 2021-11-16 — End: 2021-12-13

## 2021-11-16 MED ORDER — SERTRALINE HCL 25 MG PO TABS: 25.0000 mg | ORAL_TABLET | Freq: Every day | ORAL | 0 refills | Status: DC

## 2021-11-16 NOTE — Progress Notes (Signed)
? ?BP 140/88   Pulse 94   Temp 98.8 ?F (37.1 ?C) (Oral)   Resp 16   Ht 5' 0.5" (1.537 m)   Wt 141 lb 14.4 oz (64.4 kg)   LMP 06/20/2021   SpO2 99%   BMI 27.26 kg/m?   ? ?Subjective:  ? ? Patient ID: Candice Walker, female    DOB: [birth year], 36 y.o.   MRN: [id number] ? ?HPI: ?Candice Walker is a 36 y.o. female, here with host and interpreter ? ?Chief Complaint  ?Patient presents with  ? Establish Care  ? Abdominal Pain  ?  While having intercorse  ? ?Establish care: Patient previously lived in [place] and left as a refugee she resided in a refugee camp in [place] then in [place] and now lives in [place] with a host family along with her husband and 7 children.  He is here today with her host and an interpreter.  She has a history of hypertension, beta thalassemia, iron deficiency anemia and GERD. ? ?HTN: She is currently taking labetalol 100 mg twice daily.  Her blood pressure continues to be elevated.  Her blood pressure here was 150/92, recheck was 140/88.  She denies any chest pain shortness of breath headaches or blurred vision.  We will start her on HCTZ 12.5 mg daily.  Patient states that she does check her blood pressure at home and will keep a log. ? ?Beta thalassemia/iron deficiency anemia: She is currently being seen by hematology, last visit was 10/22/2021.  She had a vaginal delivery in September 2022 and received IV iron prior to that delivery.  Does report that she does have heavy menstrual cycle her cycle has not started again since having the baby but she is breast-feeding.  Hematology's plan is to repeat blood work in 3 months.  ? ?GERD: Says she used to have a lot of burning in her epigastric area she has recently started omeprazole and has been doing well with that treatment. ? ?Constipation: She is currently on iron supplementation and complaining of constipation.  Discussed that this is a side effect of taking the iron.  Discussed increasing fiber intake and water intake.   Discussed that she might need to take a over-the-counter stool softener.  She verbalized understanding ? ?Dyspareunia: She says that for the last 5 months she has had painful intercourse.  She says she had a baby in September of last year and has had pain ever since.  This was her seventh child she says after each delivery she went through this.  Discussed that she will need to follow up with gyn.  Patient supposed had already started to schedule patient for GYN visit during this visit.  ? ?Depression: She reports that she has been sad lately with all the changes of moving to a different country and being lonely.  She denies any suicidal thoughts.  Discussed trying therapy and possibility of medications.  Patient request starting a medication.  We will start her on low-dose Zoloft. ? ? ?  11/16/2021  ?  3:17 PM 11/16/2021  ?  2:48 PM 03/22/2021  ?  5:01 PM 03/06/2021  ? 10:48 AM  ?Depression screen PHQ 2/9  ?Decreased Interest 1 0 0 0  ?Down, Depressed, Hopeless 1 0 0 0  ?PHQ - 2 Score 2 0 0 0  ?Altered sleeping 0     ?Tired, decreased energy 1     ?Change in appetite 0     ?Feeling bad or failure about  yourself  0     ?Trouble concentrating 0     ?Moving slowly or fidgety/restless 1     ?Suicidal thoughts 0     ?PHQ-9 Score 4     ?Difficult doing work/chores Not difficult at all     ?  ? ?Relevant past medical, surgical, family and social history reviewed and updated as indicated. Interim medical history since our last visit reviewed. ?Allergies and medications reviewed and updated. ? ?Review of Systems ? ?Constitutional: Negative for fever or weight change.  ?Respiratory: Negative for cough and shortness of breath.   ?Cardiovascular: Negative for chest pain or palpitations.  ?Gastrointestinal: Negative for abdominal pain, constipation  ?Musculoskeletal: Negative for gait problem or joint swelling.  ?Skin: Negative for rash.  ?Neurological: Negative for dizziness or headache.  ?No other specific complaints in a complete  review of systems (except as listed in HPI above).  ? ?   ?Objective:  ?  ?BP 140/88   Pulse 94   Temp 98.8 ?F (37.1 ?C) (Oral)   Resp 16   Ht 5' 0.5" (1.537 m)   Wt 141 lb 14.4 oz (64.4 kg)   LMP 06/20/2021   SpO2 99%   BMI 27.26 kg/m?   ?Wt Readings from Last 3 Encounters:  ?11/16/21 141 lb 14.4 oz (64.4 kg)  ?10/22/21 146 lb 6.4 oz (66.4 kg)  ?06/25/21 142 lb 6.4 oz (64.6 kg)  ?  ?Physical Exam ? ?Constitutional: Patient appears well-developed and well-nourished.  No distress.  ?HEENT: head atraumatic, normocephalic, pupils equal and reactive to light, neck supple ?Cardiovascular: Normal rate, regular rhythm and normal heart sounds.  No murmur heard. No BLE edema. ?Pulmonary/Chest: Effort normal and breath sounds normal. No respiratory distress. ?Abdominal: Soft.  There is generalized tenderness. ?Psychiatric: Patient has a normal mood and affect. behavior is normal. Judgment and thought content normal.  ?Results for orders placed or performed in visit on 10/22/21  ?Basic metabolic panel  ?Result Value Ref Range  ? Sodium 136 135 - 145 mmol/L  ? Potassium 3.9 3.5 - 5.1 mmol/L  ? Chloride 106 98 - 111 mmol/L  ? CO2 23 22 - 32 mmol/L  ? Glucose, Bld 103 (H) 70 - 99 mg/dL  ? BUN 14 6 - 20 mg/dL  ? Creatinine, Ser 0.51 0.44 - 1.00 mg/dL  ? Calcium 8.6 (L) 8.9 - 10.3 mg/dL  ? GFR, Estimated >60 >60 mL/min  ? Anion gap 7 5 - 15  ?CBC with Differential/Platelet  ?Result Value Ref Range  ? WBC 7.9 4.0 - 10.5 K/uL  ? RBC 5.75 (H) 3.87 - 5.11 MIL/uL  ? Hemoglobin 10.8 (L) 12.0 - 15.0 g/dL  ? HCT 34.8 (L) 36.0 - 46.0 %  ? MCV 60.5 (L) 80.0 - 100.0 fL  ? MCH 18.8 (L) 26.0 - 34.0 pg  ? MCHC 31.0 30.0 - 36.0 g/dL  ? RDW 16.1 (H) 11.5 - 15.5 %  ? Platelets 247 150 - 400 K/uL  ? nRBC 0.0 0.0 - 0.2 %  ? Neutrophils Relative % 58 %  ? Neutro Abs 4.6 1.7 - 7.7 K/uL  ? Lymphocytes Relative 31 %  ? Lymphs Abs 2.4 0.7 - 4.0 K/uL  ? Monocytes Relative 7 %  ? Monocytes Absolute 0.6 0.1 - 1.0 K/uL  ? Eosinophils Relative 2 %  ?  Eosinophils Absolute 0.1 0.0 - 0.5 K/uL  ? Basophils Relative 1 %  ? Basophils Absolute 0.1 0.0 - 0.1 K/uL  ? Immature Granulocytes 1 %  ?  Abs Immature Granulocytes 0.08 (H) 0.00 - 0.07 K/uL  ?Ferritin  ?Result Value Ref Range  ? Ferritin 126 11 - 307 ng/mL  ?Iron and TIBC([facility])  ?Result Value Ref Range  ? Iron 80 28 - 170 ug/dL  ? TIBC 350 250 - 450 ug/dL  ? Saturation Ratios 23 10.4 - 31.8 %  ? UIBC 270 ug/dL  ? ?   ?Assessment & Plan:  ? ?1. Hypertension, unspecified type ?Continue to take labetalol ?Do salt intake ?get some physical activity every day ?Check blood pressure and keep log ?- hydrochlorothiazide (HYDRODIURIL) 12.5 MG tablet; Take 1 tablet (12.5 mg total) by mouth daily.  Dispense: 30 tablet; Refill: 1 ? ?2. Beta thalassemia ([place]) ?Keep follow-up appointment with hematology ? ?3. Iron deficiency anemia during pregnancy ?Follow-up appointment with hematology ? ?4. Gastroesophageal reflux disease without esophagitis ?Continue to take omeprazole ? ?5. Drug-induced constipation ?Increase fiber and water intake ?They take over-the-counter stool softener if needed ?6. Dyspareunia, female ?Follow-up with OB/GYN ? ?7. Mild episode of recurrent major depressive disorder ([place]) ? ?- sertraline (ZOLOFT) 25 MG tablet; Take 1 tablet (25 mg total) by mouth daily.  Dispense: 30 tablet; Refill: 0  ? ?Follow up plan: ?Return in about 4 weeks (around 12/14/2021) for follow up. ? ? ? ? ? ?

## 2021-11-21 ENCOUNTER — Ambulatory Visit: Payer: Self-pay

## 2021-11-21 NOTE — Telephone Encounter (Signed)
Answer Assessment - Initial Assessment Questions ?1. NAME of MEDICATION: "What medicine are you calling about?" ?    Hydrochlorothiazide  ?2. QUESTION: "What is your question?" (e.g., double dose of medicine, side effect) ?    Take with her labetalol ?3. PRESCRIBING HCP: "Who prescribed it?" Reason: if prescribed by specialist, call should be referred to that group. ?    [facility] ?4. SYMPTOMS: "Do you have any symptoms?" ?    Did not feel well, BP 170/90 ?5. SEVERITY: If symptoms are present, ask "Are they mild, moderate or severe?" ?    Mild ?6. PREGNANCY:  "Is there any chance that you are pregnant?" "When was your last menstrual period?" ?    No ? ?Protocols used: Medication Question Call-A-AH ? ?

## 2021-11-21 NOTE — Telephone Encounter (Signed)
?  Chief Complaint: Ms. Gloris Manchester calling to verify new medications that were ordered on last visit 11/16/21. Reviewed medications. States pt. Has not been taking medications due to fasting for Ramadan. BP yesterday 170/90. Instructed to instruct pt. The importance of compliance. Verbalizes understanding.  ?Symptoms: Did "not feel well yesterday." ?Frequency: Yesterday ?Pertinent Negatives: Patient denies ?Disposition: [] ED /[] Urgent Care (no appt availability in office) / [] Appointment(In office/virtual)/ []  [facility] [facility]/ [] Home Care/ [] Refused Recommended Disposition /[] [facility]/ []  Follow-up with PCP ?Additional Notes: Instructed to call back if no better after taking medication.  ?

## 2021-11-23 DIAGNOSIS — F411 Generalized anxiety disorder: Secondary | ICD-10-CM | POA: Diagnosis not present

## 2021-11-27 DIAGNOSIS — R102 Pelvic and perineal pain: Secondary | ICD-10-CM | POA: Diagnosis not present

## 2021-11-27 DIAGNOSIS — R42 Dizziness and giddiness: Secondary | ICD-10-CM | POA: Diagnosis not present

## 2021-11-27 DIAGNOSIS — N941 Unspecified dyspareunia: Secondary | ICD-10-CM | POA: Diagnosis not present

## 2021-11-27 DIAGNOSIS — R531 Weakness: Secondary | ICD-10-CM | POA: Diagnosis not present

## 2021-11-27 DIAGNOSIS — R5383 Other fatigue: Secondary | ICD-10-CM | POA: Diagnosis not present

## 2021-11-30 DIAGNOSIS — F411 Generalized anxiety disorder: Secondary | ICD-10-CM | POA: Diagnosis not present

## 2021-12-03 DIAGNOSIS — Z419 Encounter for procedure for purposes other than remedying health state, unspecified: Secondary | ICD-10-CM | POA: Diagnosis not present

## 2021-12-07 DIAGNOSIS — F411 Generalized anxiety disorder: Secondary | ICD-10-CM | POA: Diagnosis not present

## 2021-12-08 ENCOUNTER — Other Ambulatory Visit: Payer: Self-pay | Admitting: Nurse Practitioner

## 2021-12-08 DIAGNOSIS — F330 Major depressive disorder, recurrent, mild: Secondary | ICD-10-CM

## 2021-12-08 DIAGNOSIS — I10 Essential (primary) hypertension: Secondary | ICD-10-CM

## 2021-12-10 ENCOUNTER — Other Ambulatory Visit: Payer: Self-pay

## 2021-12-10 DIAGNOSIS — F330 Major depressive disorder, recurrent, mild: Secondary | ICD-10-CM

## 2021-12-10 NOTE — Telephone Encounter (Signed)
Requested medications are due for refill today.  yes ? ?Requested medications are on the active medications list.  yes ? ?Last refill. 11/16/2021 #30 0 refills ? ?Future visit scheduled.   yes ? ?Notes to clinic.  Pharmacy needs Dx code. ? ? ? ?Requested Prescriptions  ?Pending Prescriptions Disp Refills  ? sertraline (ZOLOFT) 25 MG tablet [Pharmacy Med Name: SERTRALINE HCL 25 MG TABLET] 90 tablet 1  ?  Sig: Take 1 tablet (25 mg total) by mouth daily.  ?  ? Psychiatry:  Antidepressants - SSRI - sertraline Passed - 12/08/2021 11:36 AM  ?  ?  Passed - AST in normal range and within 360 days  ?  AST  ?Date Value Ref Range Status  ?04/28/2021 19 15 - 41 U/L Final  ?  ?  ?  ?  Passed - ALT in normal range and within 360 days  ?  ALT  ?Date Value Ref Range Status  ?04/28/2021 11 0 - 44 U/L Final  ?  ?  ?  ?  Passed - Completed PHQ-2 or PHQ-9 in the last 360 days  ?  ?  Passed - Valid encounter within last 6 months  ?  Recent Outpatient Visits   ? ?      ? 3 weeks ago Hypertension, unspecified type  ? [facility] Bo Merino, FNP  ? ?  ?  ?Future Appointments   ? ?        ? In 3 days Teodora Medici, DO [facility], [place]  ? ?  ? ? ?  ?  ?  ?  ?

## 2021-12-10 NOTE — Telephone Encounter (Signed)
Rx 11/16/21 #30 1RF- too soon ?Requested Prescriptions  ?Pending Prescriptions Disp Refills  ?? hydrochlorothiazide (HYDRODIURIL) 12.5 MG tablet [Pharmacy Med Name: HYDROCHLOROTHIAZIDE 12.5 MG TB] 30 tablet 1  ?  Sig: TAKE 1 TABLET BY MOUTH EVERY DAY  ?  ? Cardiovascular: Diuretics - Thiazide Failed - 12/08/2021 12:30 PM  ?  ?  Failed - Last BP in normal range  ?  BP Readings from Last 1 Encounters:  ?11/16/21 140/88  ?   ?  ?  Passed - Cr in normal range and within 180 days  ?  Creatinine, Ser  ?Date Value Ref Range Status  ?10/22/2021 0.51 0.44 - 1.00 mg/dL Final  ? ?Creatinine, Urine  ?Date Value Ref Range Status  ?04/28/2021 159 mg/dL Final  ?   ?  ?  Passed - K in normal range and within 180 days  ?  Potassium  ?Date Value Ref Range Status  ?10/22/2021 3.9 3.5 - 5.1 mmol/L Final  ?   ?  ?  Passed - Na in normal range and within 180 days  ?  Sodium  ?Date Value Ref Range Status  ?10/22/2021 136 135 - 145 mmol/L Final  ?   ?  ?  Passed - Valid encounter within last 6 months  ?  Recent Outpatient Visits   ?      ? 3 weeks ago Hypertension, unspecified type  ? [facility] Berniece Salines, FNP  ?  ?  ?Future Appointments   ?        ? In 3 days Margarita Mail, DO [facility]  ?  ? ?  ?  ?  ? ? ?

## 2021-12-13 ENCOUNTER — Ambulatory Visit: Payer: Medicaid Other | Admitting: Internal Medicine

## 2021-12-13 ENCOUNTER — Encounter: Payer: Self-pay | Admitting: Internal Medicine

## 2021-12-13 VITALS — BP 132/78 | HR 85 | Temp 98.0°F | Resp 16 | Ht 60.5 in | Wt 142.3 lb

## 2021-12-13 DIAGNOSIS — D509 Iron deficiency anemia, unspecified: Secondary | ICD-10-CM

## 2021-12-13 DIAGNOSIS — F330 Major depressive disorder, recurrent, mild: Secondary | ICD-10-CM

## 2021-12-13 DIAGNOSIS — I10 Essential (primary) hypertension: Secondary | ICD-10-CM

## 2021-12-13 DIAGNOSIS — K219 Gastro-esophageal reflux disease without esophagitis: Secondary | ICD-10-CM

## 2021-12-13 DIAGNOSIS — M62838 Other muscle spasm: Secondary | ICD-10-CM

## 2021-12-13 DIAGNOSIS — R102 Pelvic and perineal pain: Secondary | ICD-10-CM | POA: Diagnosis not present

## 2021-12-13 MED ORDER — AMLODIPINE BESYLATE 2.5 MG PO TABS: 2.5000 mg | ORAL_TABLET | Freq: Every evening | ORAL | 0 refills | Status: DC

## 2021-12-13 MED ORDER — AMLODIPINE BESYLATE 2.5 MG PO TABS: 2.5000 mg | ORAL_TABLET | Freq: Every day | ORAL | 0 refills | Status: DC

## 2021-12-13 MED ORDER — ESCITALOPRAM OXALATE 5 MG PO TABS: 5.0000 mg | ORAL_TABLET | Freq: Every day | ORAL | 0 refills | Status: DC

## 2021-12-13 MED ORDER — TIZANIDINE HCL 4 MG PO TABS: ORAL_TABLET | ORAL | 0 refills | Status: DC

## 2021-12-13 MED ORDER — TIZANIDINE HCL 4 MG PO TABS: 4.0000 mg | ORAL_TABLET | Freq: Four times a day (QID) | ORAL | 0 refills | Status: DC | PRN

## 2021-12-13 NOTE — Assessment & Plan Note (Signed)
Secondary to menorrhagia (not currently having cycles due to breastfeeding). On over the counter iron supplements daily. Following with Hematology, noted reviewed from 10/22/21. Last hgb stable at 10.8 in March. Hematology planning to recheck labs next month, will follow along.  ?

## 2021-12-13 NOTE — Progress Notes (Signed)
? ?Established Patient Office Visit ? ?Subjective   ?Patient ID: Candice Walker, female    DOB: 1986-01-24  Age: 36 y.o. MRN: [id number] ? ?Chief Complaint  ?Patient presents with  ? Follow-up  ? Hypertension  ? Depression  ? ? ?HPI ?Candice Walker is a 36 year old female here for follow up on chronic medical conditions. She is here with her friend and with an interpreter to aid in communication.  ? ?Hypertension: ?-Medications: Labetolol 100 BID, started on HCTZ 12.5 mg at LOV. Not doing well with HCTZ, making her feel fatigued with chest pains, increased urination so she stopped taking it a few weeks ago.  ?-Checking BP at home (average): yes; at night 161/100 last night, usually higher at night  ?-Denies any SOB, CP, vision changes, LE edema or symptoms of hypotension ? ?Beta Thalassemia/IDA:  ?-Following with Hematology, last visit 10/22/21, note reviewed.  ?-She does have a history of menorrhagia in the past (not currently having cycles due to breastfeeding) and has had to be on IV iron previously. She is currently on oral iron bisglycinate daily. Last Hgb 10.8 in March. Plan to recheck labs in June.  ? ?GERD: ?-Currently on Omeprazole 40 mg daily, doing well and controlling symptoms.  ? ?MDD: ?-Mood status: exacerbated ?-Current treatment: Zoloft 25 mg added at LOV. Felt like blood pressure was higher and more apathetic, didn't want to talk to anyone or socialize ?-Satisfied with current treatment?: no ?-Symptom severity: severe  ?-Side effects: yes ?Medication compliance: good compliance ?Psychotherapy/counseling: no  ?Previous psychiatric medications: took something while in [place] but doesn't remember  ?Depressed mood: yes ?Anxious mood: yes ?Anhedonia: yes ?Insomnia: yes hard to fall asleep ?Fatigue: yes ?Feelings of worthlessness or guilt: no ?Impaired concentration/indecisiveness: no ?Suicidal ideations: no ? ? ?  12/13/2021  ? 12:58 PM 11/16/2021  ?  3:17 PM 11/16/2021  ?  2:48 PM 03/22/2021  ?  5:01 PM  03/06/2021  ? 10:48 AM  ?Depression screen PHQ 2/9  ?Decreased Interest 1 1 0 0 0  ?Down, Depressed, Hopeless 1 1 0 0 0  ?PHQ - 2 Score 2 2 0 0 0  ?Altered sleeping 2 0     ?Tired, decreased energy 3 1     ?Change in appetite 3 0     ?Feeling bad or failure about yourself  0 0     ?Trouble concentrating 2 0     ?Moving slowly or fidgety/restless 1 1     ?Suicidal thoughts 0 0     ?PHQ-9 Score 13 4     ?Difficult doing work/chores Somewhat difficult Not difficult at all     ? ? ? ? ?Review of Systems  ?Constitutional:  Positive for malaise/fatigue. Negative for chills and fever.  ?Respiratory:  Negative for shortness of breath.   ?Cardiovascular:  Negative for chest pain and leg swelling.  ?Gastrointestinal:  Negative for abdominal pain, heartburn, nausea and vomiting.  ?Neurological:  Negative for headaches.  ?Psychiatric/Behavioral:  Positive for depression. The patient has insomnia.   ? ?  ?Objective:  ?  ? ?BP 132/78   Pulse 85   Temp 98 ?F (36.7 ?C)   Resp 16   Ht 5' 0.5" (1.537 m)   Wt 142 lb 4.8 oz (64.5 kg)   SpO2 98%   Breastfeeding Yes   BMI 27.33 kg/m?  ?BP Readings from Last 3 Encounters:  ?12/13/21 132/78  ?11/16/21 140/88  ?10/22/21 (!) 133/91  ? ?Wt Readings from Last 3 Encounters:  ?  12/13/21 142 lb 4.8 oz (64.5 kg)  ?11/16/21 141 lb 14.4 oz (64.4 kg)  ?10/22/21 146 lb 6.4 oz (66.4 kg)  ? ?  ? ?Physical Exam ?Constitutional:   ?   Appearance: Normal appearance.  ?HENT:  ?   Head: Normocephalic and atraumatic.  ?   Mouth/Throat:  ?   Mouth: Mucous membranes are moist.  ?   Pharynx: Oropharynx is clear.  ?Eyes:  ?   Conjunctiva/sclera: Conjunctivae normal.  ?Cardiovascular:  ?   Rate and Rhythm: Normal rate and regular rhythm.  ?Pulmonary:  ?   Effort: Pulmonary effort is normal.  ?   Breath sounds: Normal breath sounds.  ?Musculoskeletal:  ?   Right lower leg: No edema.  ?   Left lower leg: No edema.  ?Skin: ?   General: Skin is warm and dry.  ?Neurological:  ?   General: No focal deficit present.   ?   Mental Status: She is alert. Mental status is at baseline.  ?Psychiatric:     ?   Mood and Affect: Mood normal.     ?   Behavior: Behavior normal.  ? ? ? ?No results found for any visits on 12/13/21. ? ?Last CBC ?Lab Results  ?Component Value Date  ? WBC 7.9 10/22/2021  ? HGB 10.8 (L) 10/22/2021  ? HCT 34.8 (L) 10/22/2021  ? MCV 60.5 (L) 10/22/2021  ? MCH 18.8 (L) 10/22/2021  ? RDW 16.1 (H) 10/22/2021  ? PLT 247 10/22/2021  ? ?Last metabolic panel ?Lab Results  ?Component Value Date  ? GLUCOSE 103 (H) 10/22/2021  ? NA 136 10/22/2021  ? K 3.9 10/22/2021  ? CL 106 10/22/2021  ? CO2 23 10/22/2021  ? BUN 14 10/22/2021  ? CREATININE 0.51 10/22/2021  ? GFRNONAA >60 10/22/2021  ? CALCIUM 8.6 (L) 10/22/2021  ? PROT 6.2 (L) 04/28/2021  ? ALBUMIN 3.1 (L) 04/28/2021  ? LABGLOB 2.8 02/01/2021  ? AGRATIO 1.2 02/01/2021  ? BILITOT 0.8 04/28/2021  ? ALKPHOS 141 (H) 04/28/2021  ? AST 19 04/28/2021  ? ALT 11 04/28/2021  ? ANIONGAP 7 10/22/2021  ? ?Last lipids ?No results found for: CHOL, HDL, LDLCALC, LDLDIRECT, TRIG, CHOLHDL ?Last hemoglobin A1c ?Lab Results  ?Component Value Date  ? HGBA1C 5.4 04/28/2021  ? ?Last thyroid functions ?No results found for: TSH, T3TOTAL, T4TOTAL, THYROIDAB ?Last vitamin D ?No results found for: 25OHVITD2, 25OHVITD3, VD25OH ?Last vitamin B12 and Folate ?Lab Results  ?Component Value Date  ? [id number] 284 02/01/2021  ? FOLATE 56.0 02/01/2021  ? ?  ? ?The ASCVD Risk score (Arnett DK, et al., 2019) failed to calculate for the following reasons: ?  The 2019 ASCVD risk score is only valid for ages 7640 to 1979 ? ?  ?Assessment & Plan:  ? ?Problem List Items Addressed This Visit   ? ?  ? Cardiovascular and Mediastinum  ? Hypertension - Primary  ?  Blood pressure good here today but high at home. Did not do well with HCTZ, caused fatigue and chest pain so this will be discontinued. Start Amlodipine 2.5 mg at night and continue Labetalol 100 mg BID. Continue to check blood pressure at home and follow up here  in 1 month for recheck.  ? ?  ?  ? Relevant Medications  ? amLODipine (NORVASC) 2.5 MG tablet  ?  ? Digestive  ? Gastroesophageal reflux disease without esophagitis  ?  Stable, doing well on Omeprazole 40 mg daily.  ? ?  ?  ?  ?  Other  ? Iron deficiency anemia  ?  Secondary to menorrhagia (not currently having cycles due to breastfeeding). On over the counter iron supplements daily. Following with Hematology, noted reviewed from 10/22/21. Last hgb stable at 10.8 in March. Hematology planning to recheck labs next month, will follow along.  ? ?  ?  ? Mild episode of recurrent major depressive disorder (HCC)  ?  Did not do well with Zoloft, caused her to feel apathetic. Discontinue Zoloft and start Lexapro 5 mg daily. Potential side effects discussed in detail. Follow up in 1 month for reevaluation.  ? ?  ?  ? Relevant Medications  ? escitalopram (LEXAPRO) 5 MG tablet  ? ?Other Visit Diagnoses   ? ? Muscle spasms of both lower extremities    - had been given Gabapentin from a previous provider for sleep, which she is taking. Discussed discontinuing this medication and I will give her a muscle relaxer to help with both muscle spasms and sleep.   ? Relevant Medications  ? tiZANidine (ZANAFLEX) 4 MG tablet  ? ?  ? ? ?Return in about 4 weeks (around 01/10/2022).  ? ? ?Margarita Mail, DO ? ?

## 2021-12-13 NOTE — Patient Instructions (Addendum)
It was great seeing you today! ? ?Plan discussed at today's visit: ?-Stop HCTZ and start low dose Amlodipine 2.5 mg at night  ?-Continue to check blood pressure at home ?-Stop Zoloft, start Lexapro 5 mg daily - if you have symptoms  ?-Stop Gabapentin, this is primarily a neuropathic pain medication, I will send you a muscle relaxer to take as needed for muscle spasms that will also help with sleep. Do not take before you drive.  ? ?Follow up in: 1 month  ? ?Take care and let us know if you have any questions or concerns prior to your next visit. ? ?Dr. Caralee Ates ? ?

## 2021-12-13 NOTE — Assessment & Plan Note (Signed)
Stable, doing well on Omeprazole 40 mg daily.  ?

## 2021-12-13 NOTE — Assessment & Plan Note (Signed)
Blood pressure good here today but high at home. Did not do well with HCTZ, caused fatigue and chest pain so this will be discontinued. Start Amlodipine 2.5 mg at night and continue Labetalol 100 mg BID. Continue to check blood pressure at home and follow up here in 1 month for recheck.  ?

## 2021-12-13 NOTE — Assessment & Plan Note (Signed)
Did not do well with Zoloft, caused her to feel apathetic. Discontinue Zoloft and start Lexapro 5 mg daily. Potential side effects discussed in detail. Follow up in 1 month for reevaluation.  ?

## 2021-12-14 DIAGNOSIS — F411 Generalized anxiety disorder: Secondary | ICD-10-CM | POA: Diagnosis not present

## 2021-12-17 IMAGING — US US MFM FETAL BPP W/O NON-STRESS
1 series · 13 of 28 positions shown · non-contrast
Comparison: none

[Series 1: us mfm fetal bpp w/o non-stress · 35 acquisitions, 13 frames shown]
[im 2/35]
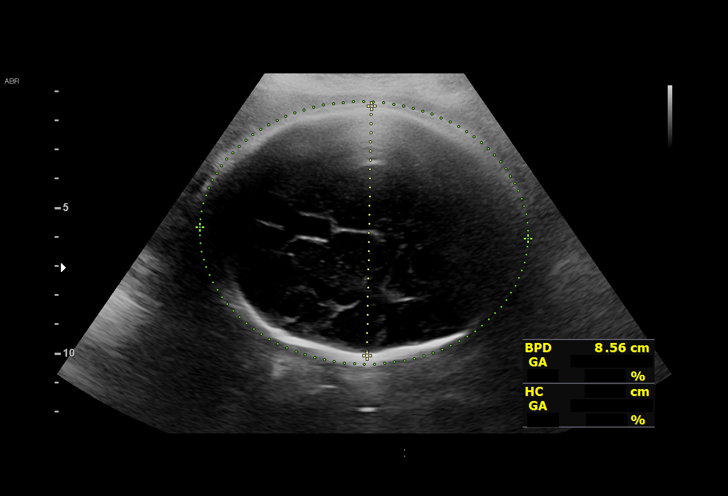
[im 4/35]
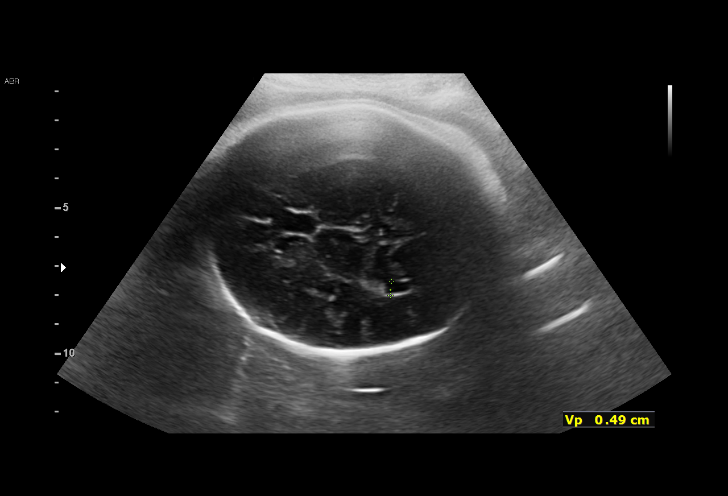
[im 7/35]
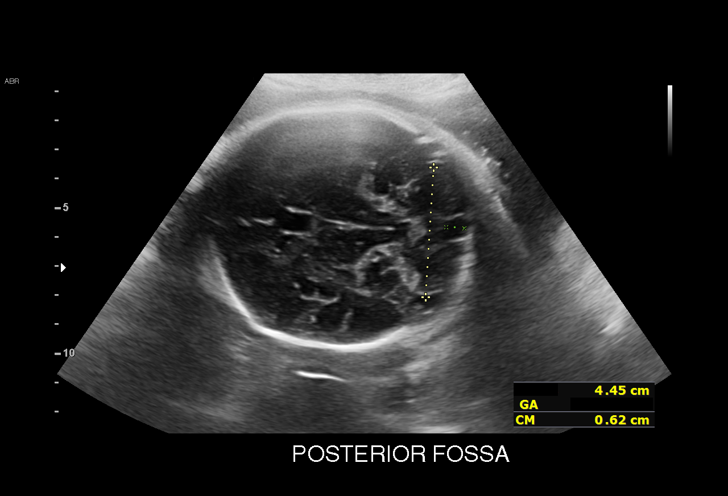
[im 9/35]
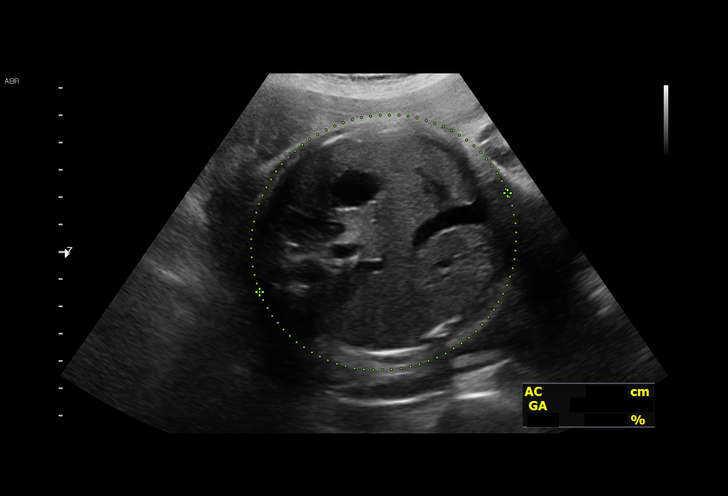
[im 12/35]
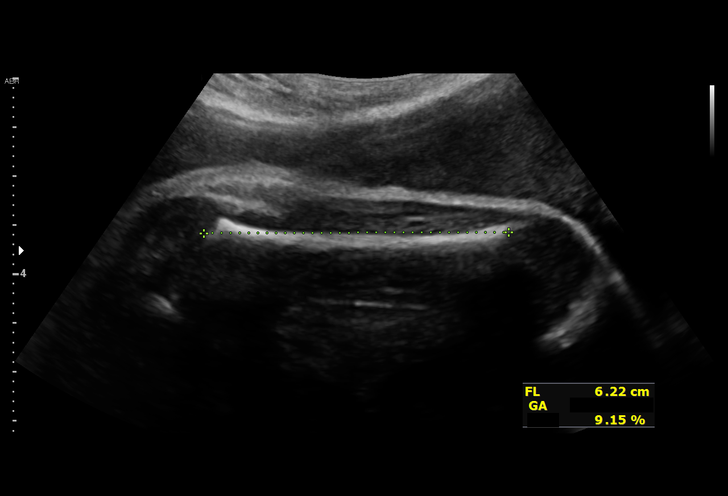
[im 14/35]
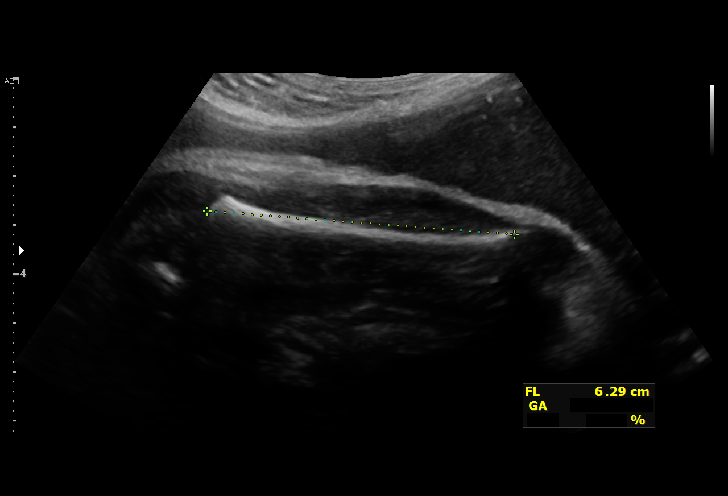
[im 18/35]
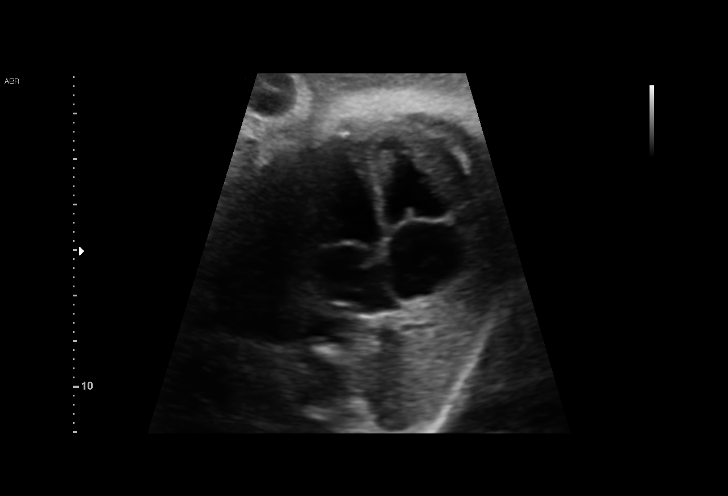
[im 21/35]
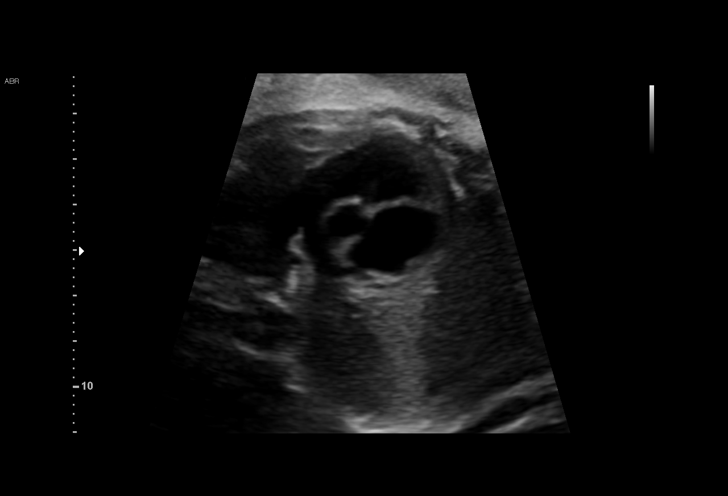
[im 23/35]
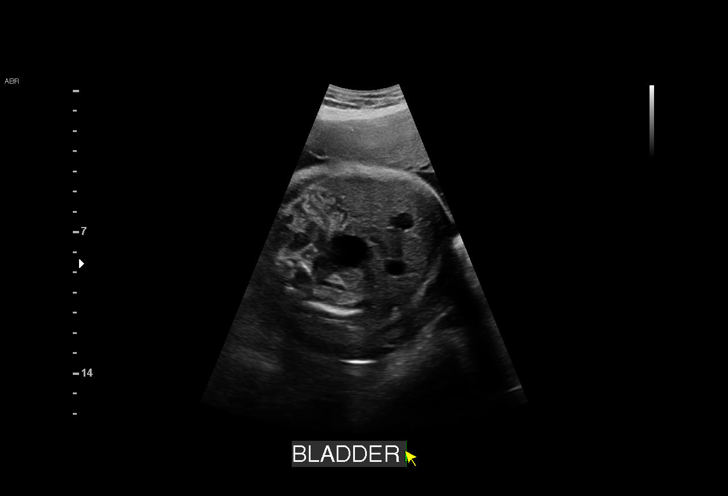
[im 26/35]
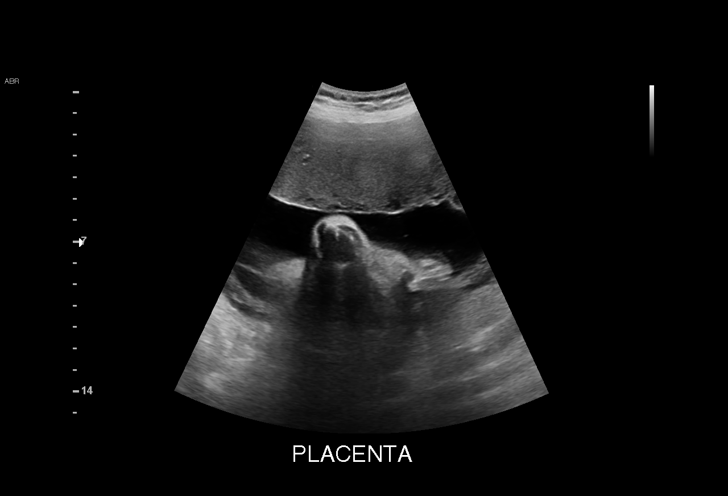
[im 28/35]
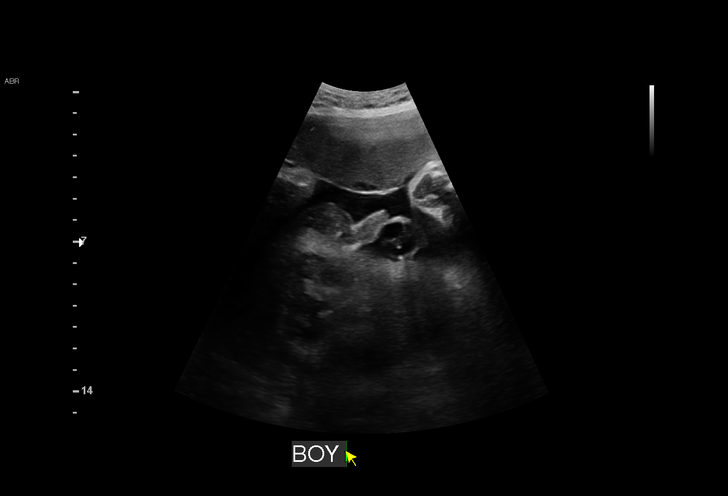
[im 31/35]
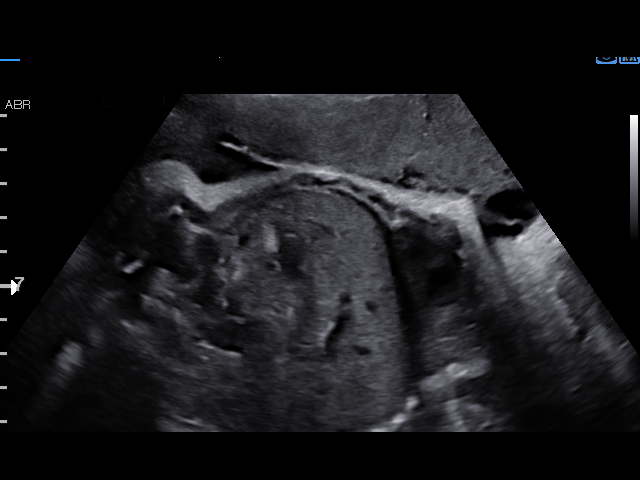
[im 33/35]
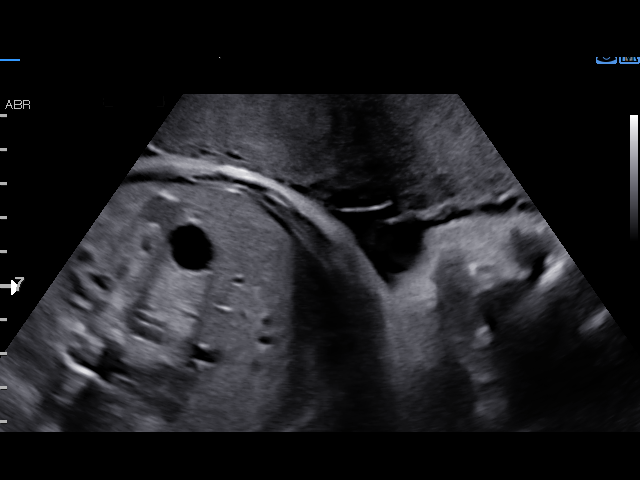

[13 of 28 positions shown; findings below may reference images not displayed]

GAMIL

                                                      MAZTOLA

Indications

 Pre-existing essential hypertension
 complicating pregnancy, third trimester
 Gestational diabetes in pregnancy,
 unspecified control
 Anemia during pregnancy in third trimester
 Advanced maternal age multigravida 35+,
 third trimester
 33 weeks gestation of pregnancy
 Grand multiparity, antepartum
Fetal Evaluation

 Num Of Fetuses:         1
 Fetal Heart Rate(bpm):  148
 Cardiac Activity:       Observed
 Presentation:           Cephalic
 Placenta:               Anterior
 P. Cord Insertion:      Visualized, central

 Amniotic Fluid
 AFI FV:      Within normal limits

 AFI Sum(cm)     %Tile       Largest Pocket(cm)
 12.7            39

 RUQ(cm)       RLQ(cm)       LUQ(cm)        LLQ(cm)
 1.9           3.9           3.9            3
Biophysical Evaluation

 Amniotic F.V:   Within normal limits       F. Tone:        Observed
 F. Movement:    Observed                   Score:          [DATE]
 F. Breathing:   Observed
Biometry

 BPD:      85.4  mm     G. Age:  34w 3d         67  %    CI:         72.7   %    70 - 86
                                                         FL/HC:      19.7   %    19.4 -
 HC:      318.5  mm     G. Age:  35w 6d         69  %    HC/AC:      1.07        0.96 -
 AC:       298   mm     G. Age:  33w 5d         55  %    FL/BPD:     73.5   %    71 - 87
 FL:       62.8  mm     G. Age:  32w 4d         13  %    FL/AC:      21.1   %    20 - 24
 HUM:      55.9  mm     G. Age:  32w 4d         33  %
 CER:      44.5  mm     G. Age:  34w 4d         44  %

 LV:        4.6  mm
 CM:        6.2  mm

 Est. FW:    2252  gm    4 lb 15 oz      41  %
OB History

 Gravidity:    7         Term:   6
 Living:       6
Gestational Age

 LMP:           31w 6d        Date:  08/23/20                 EDD:   05/30/21
 U/S Today:     34w 1d                                        EDD:   05/14/21
 Best:          33w 5d     Det. By:  Previous Ultrasound      EDD:   05/17/21
                                     (01/05/21)
Anatomy

 Cranium:               Appears normal         Aortic Arch:            Previously seen
 Cavum:                 Appears normal         Ductal Arch:            Previously seen
 Ventricles:            Appears normal         Diaphragm:              Appears normal
 Choroid Plexus:        Appears normal         Stomach:                Appears normal, left
                                                                       sided
 Cerebellum:            Appears normal         Abdomen:                Appears normal
 Posterior Fossa:       Appears normal         Abdominal Wall:         Previously seen
 Nuchal Fold:           Not applicable (>20    Cord Vessels:           Appears normal (3
                        wks GA)                                        vessel cord)
 Face:                  Appears normal         Kidneys:                Appear normal
                        (orbits and profile)
 Lips:                  Appears normal         Bladder:                Appears normal
 Heart:                 Appears normal         Spine:                  Previously seen
                        (4CH, axis, and
                        situs)
 RVOT:                  Appears normal         Upper Extremities:      Previously seen
 LVOT:                  Appears normal         Lower Extremities:      Previously seen
Impression

 Chronic hypertension.  Well-controlled on labetalol 50 mg
 twice daily blood pressure today at her office is 115/77 mmHg.
 Gestational diabetes.  Diet controlled.  I reviewed her blood
 glucose log and they are within normal range.
 Iron deficiency anemia.  Patient received iron transfusions.
 Her recent hemoglobin is 9 g/DL.  Serum ferritin and iron
 have returned to normal.

 Fetal growth is appropriate for gestational age.  Amniotic fluid
 is normal and good fetal activity seen.  Antenatal testing is
 reassuring.  BPP [DATE].
Recommendations

 Continue weekly antenatal testing till delivery.
                 Kerns, Jabbar

## 2021-12-21 DIAGNOSIS — K30 Functional dyspepsia: Secondary | ICD-10-CM | POA: Diagnosis not present

## 2021-12-21 DIAGNOSIS — I10 Essential (primary) hypertension: Secondary | ICD-10-CM | POA: Diagnosis not present

## 2021-12-21 DIAGNOSIS — F419 Anxiety disorder, unspecified: Secondary | ICD-10-CM | POA: Diagnosis not present

## 2021-12-21 DIAGNOSIS — E611 Iron deficiency: Secondary | ICD-10-CM | POA: Diagnosis not present

## 2021-12-21 DIAGNOSIS — G4700 Insomnia, unspecified: Secondary | ICD-10-CM | POA: Diagnosis not present

## 2021-12-21 DIAGNOSIS — F411 Generalized anxiety disorder: Secondary | ICD-10-CM | POA: Diagnosis not present

## 2021-12-21 DIAGNOSIS — F329 Major depressive disorder, single episode, unspecified: Secondary | ICD-10-CM | POA: Diagnosis not present

## 2022-01-01 ENCOUNTER — Other Ambulatory Visit: Payer: Self-pay | Admitting: Internal Medicine

## 2022-01-01 DIAGNOSIS — M62838 Other muscle spasm: Secondary | ICD-10-CM

## 2022-01-02 NOTE — Telephone Encounter (Signed)
Requested medication (s) are due for refill today: no  Requested medication (s) are on the active medication list: yes  Last refill:  12/13/21 #30  Future visit scheduled: yes  Notes to clinic:  med not delegated to NT to RF   Requested Prescriptions  Pending Prescriptions Disp Refills   tiZANidine (ZANAFLEX) 4 MG tablet [Pharmacy Med Name: TIZANIDINE HCL 4 MG TABLET] 90 tablet 1    Sig: 4 mg as needed at night for muscle spasms.     Not Delegated - Cardiovascular:  Alpha-2 Agonists - tizanidine Failed - 01/01/2022  3:53 PM      Failed - This refill cannot be delegated      Passed - Valid encounter within last 6 months    Recent Outpatient Visits           2 weeks ago Hypertension, unspecified type   [facility] Margarita Mail, DO   1 month ago Hypertension, unspecified type   [facility] Berniece Salines, FNP       Future Appointments             In 1 week Margarita Mail, DO [facility], [facility]

## 2022-01-03 DIAGNOSIS — Z419 Encounter for procedure for purposes other than remedying health state, unspecified: Secondary | ICD-10-CM | POA: Diagnosis not present

## 2022-01-07 IMAGING — US US MFM FETAL BPP W/O NON-STRESS
1 series · 15 of 20 positions shown · non-contrast
Comparison: none

[Series 1: us mfm fetal bpp w/o non-stress · 20 acquisitions, 15 frames shown]
[im 1/20]
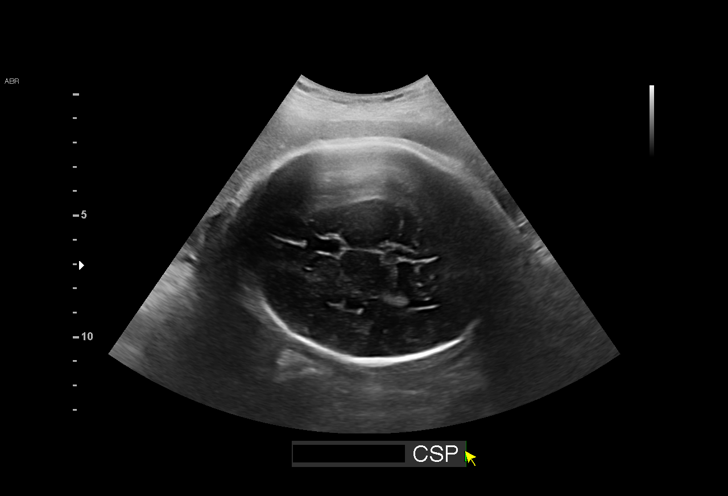
[im 3/20]
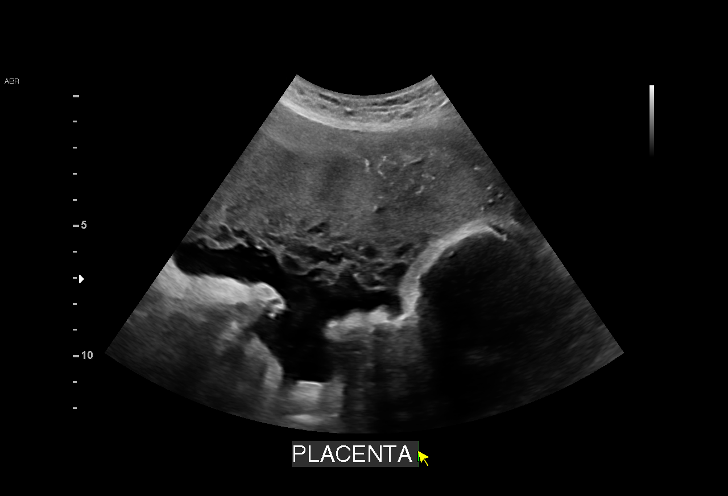
[im 4/20]
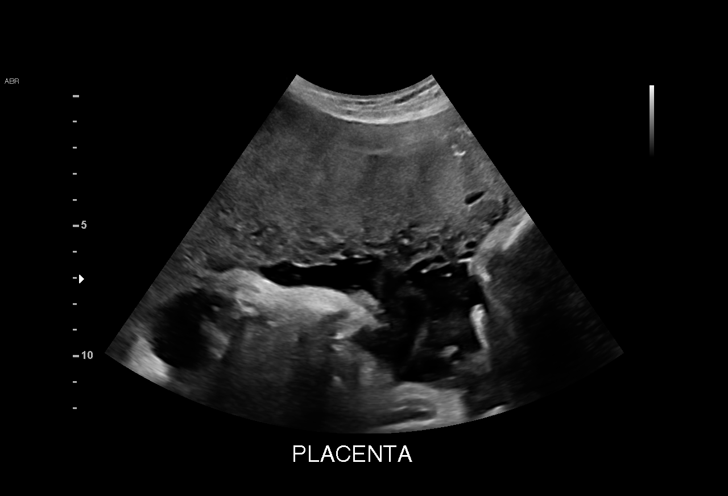
[im 5/20]
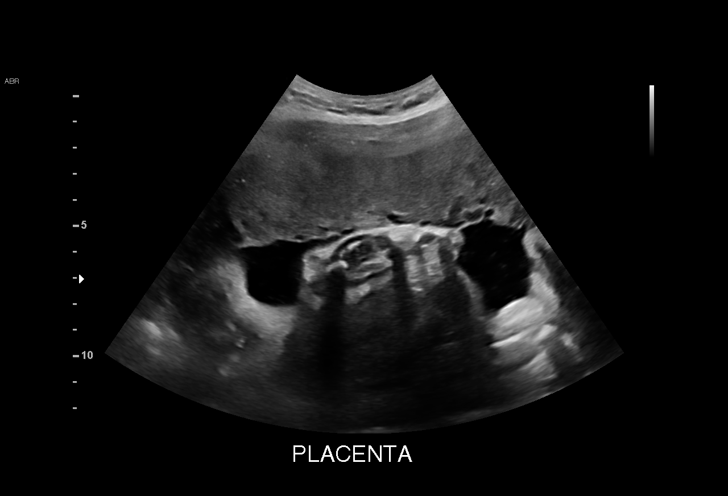
[im 7/20]
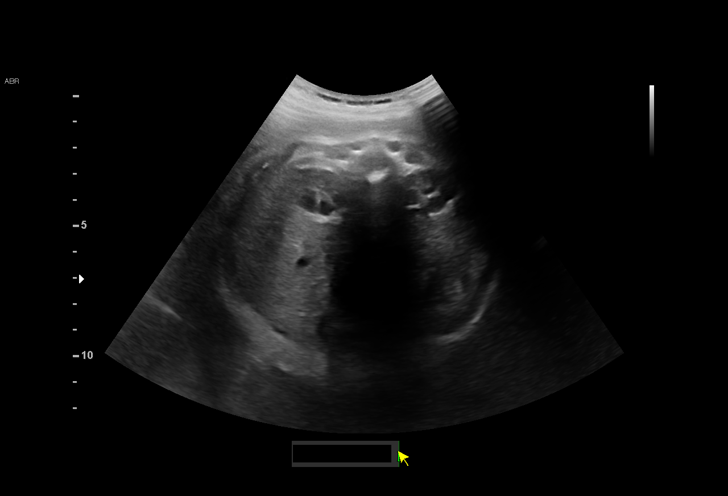
[im 8/20]
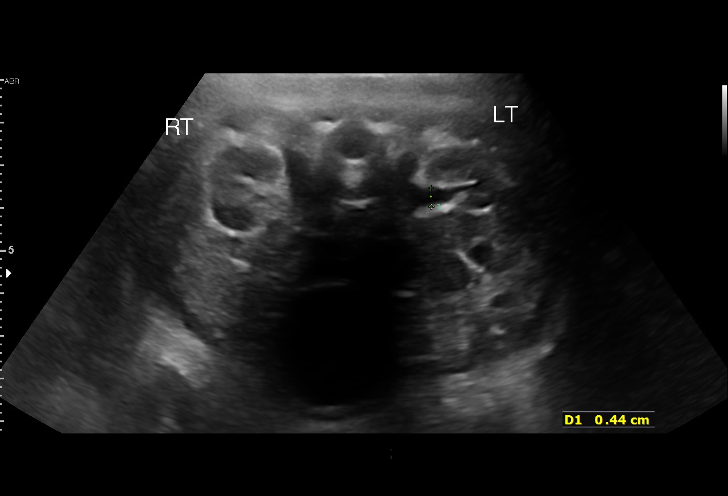
[im 9/20]
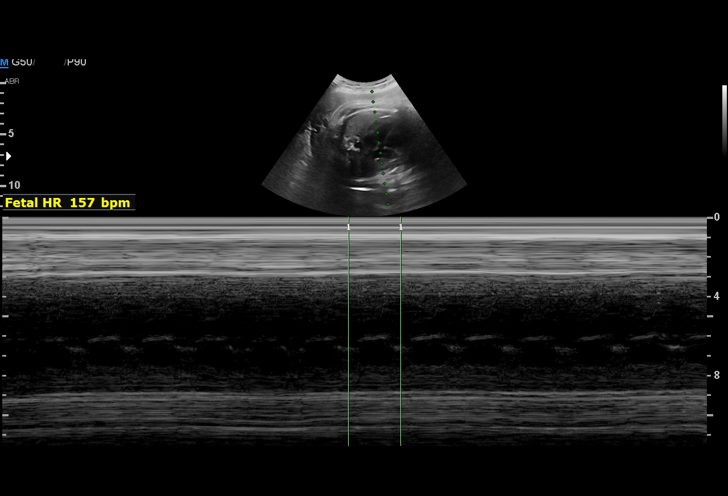
[im 11/20]
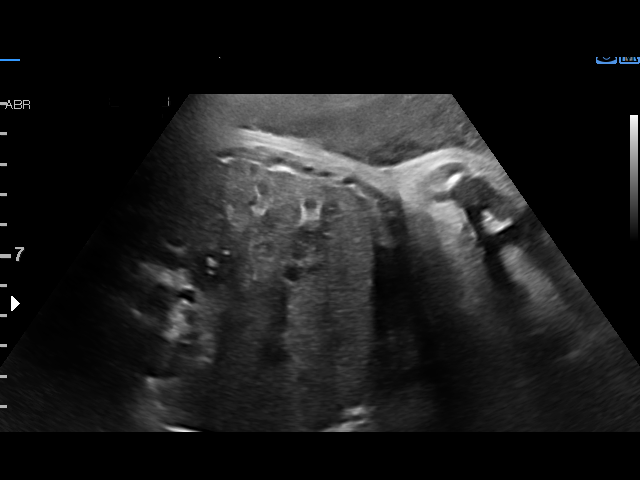
[im 12/20]
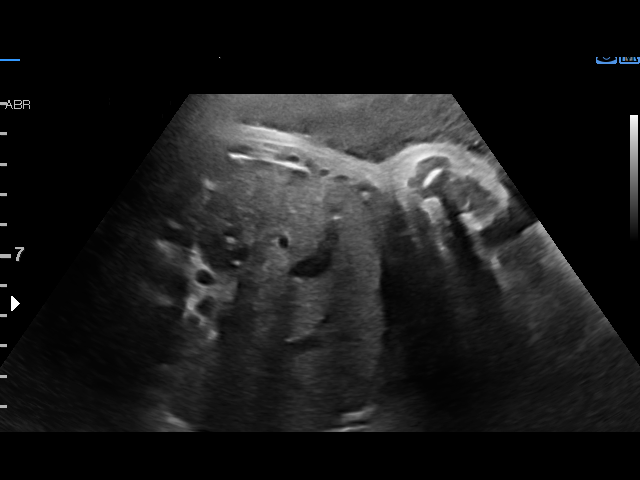
[im 13/20]
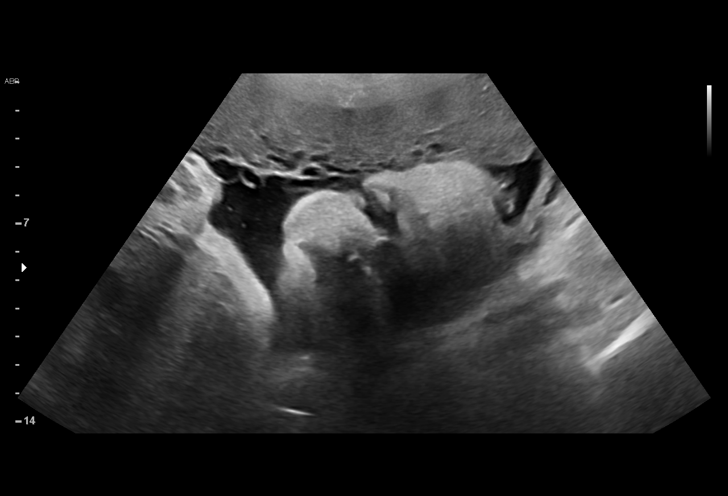
[im 15/20]
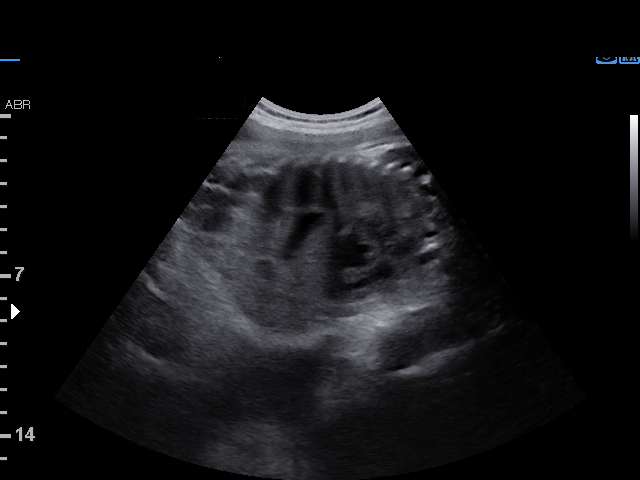
[im 16/20]
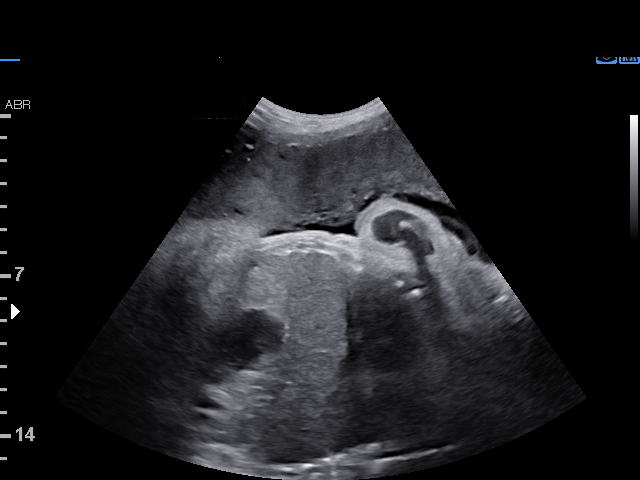
[im 17/20]
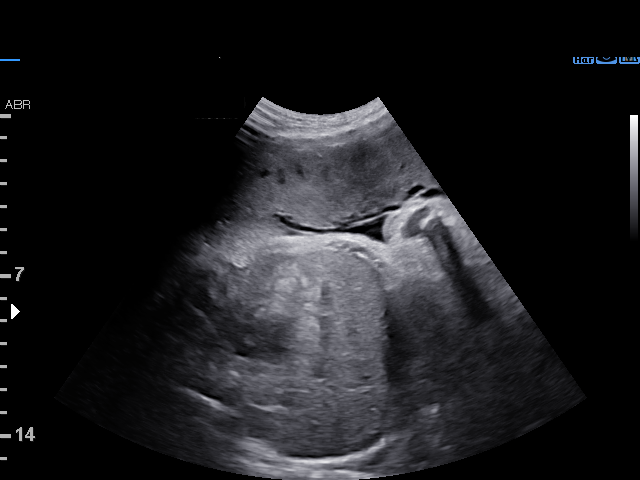
[im 19/20]
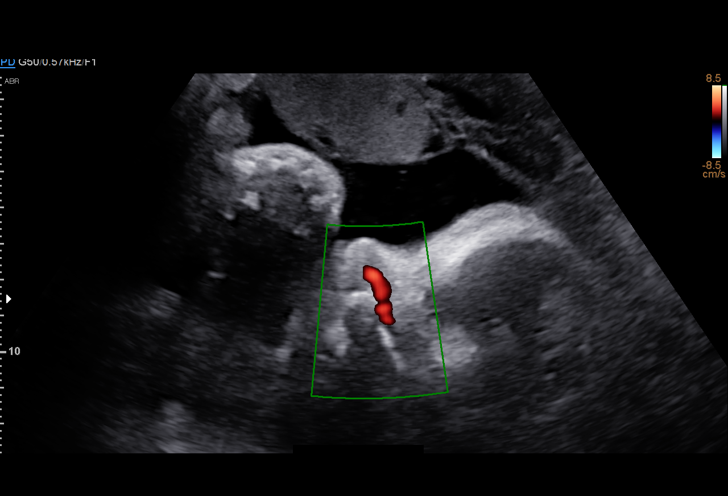
[im 20/20]
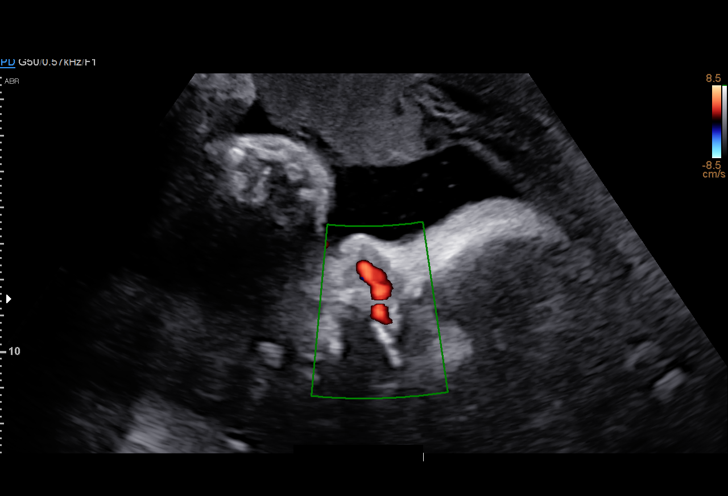

[15 of 20 positions shown; findings below may reference images not displayed]

RESENDIZ

Indications

 36 weeks gestation of pregnancy
 Pre-existing essential hypertension
 complicating pregnancy, third trimester
 Gestational diabetes in pregnancy,
 unspecified control
 Anemia during pregnancy in third trimester
 Advanced maternal age multigravida 35+,
 third trimester
 Grand multiparity, antepartum
Fetal Evaluation

 Num Of Fetuses:         1
 Fetal Heart Rate(bpm):  157
 Cardiac Activity:       Observed
 Presentation:           Cephalic
 Placenta:               Anterior

 Amniotic Fluid
 AFI FV:      Within normal limits

 AFI Sum(cm)     %Tile       Largest Pocket(cm)
 15.2            57

 RUQ(cm)       RLQ(cm)       LUQ(cm)        LLQ(cm)

Biophysical Evaluation
 Amniotic F.V:   Within normal limits       F. Tone:        Observed
 F. Movement:    Observed                   Score:          [DATE]
 F. Breathing:   Observed
OB History

 Gravidity:    7         Term:   6
 Living:       6
Gestational Age

 LMP:           34w 6d        Date:  08/23/20                 EDD:   05/30/21
 Best:          36w 5d     Det. By:  Previous Ultrasound      EDD:   05/17/21
                                     (01/05/21)
Impression

 Antenatal testing performed given maternal chronic
 hypertension and IHOMB
 The biophysical profile was [DATE] with good fetal movement and
 amniotic fluid volume.

 She is scheduled for delivery at ^37 weeks. She reports good
 fetal movement and normal blood glucose measurements.
Recommendations

 Follow up as clinically indicated.

## 2022-01-14 ENCOUNTER — Encounter: Payer: Self-pay | Admitting: Internal Medicine

## 2022-01-14 ENCOUNTER — Ambulatory Visit: Payer: Medicaid Other | Admitting: Internal Medicine

## 2022-01-14 VITALS — BP 140/84 | HR 73 | Temp 98.1°F | Resp 16 | Ht 60.5 in | Wt 140.1 lb

## 2022-01-14 DIAGNOSIS — R1013 Epigastric pain: Secondary | ICD-10-CM | POA: Diagnosis not present

## 2022-01-14 DIAGNOSIS — I10 Essential (primary) hypertension: Secondary | ICD-10-CM | POA: Diagnosis not present

## 2022-01-14 DIAGNOSIS — F330 Major depressive disorder, recurrent, mild: Secondary | ICD-10-CM | POA: Diagnosis not present

## 2022-01-14 DIAGNOSIS — R1011 Right upper quadrant pain: Secondary | ICD-10-CM

## 2022-01-14 DIAGNOSIS — D509 Iron deficiency anemia, unspecified: Secondary | ICD-10-CM

## 2022-01-14 DIAGNOSIS — K219 Gastro-esophageal reflux disease without esophagitis: Secondary | ICD-10-CM | POA: Diagnosis not present

## 2022-01-14 DIAGNOSIS — D561 Beta thalassemia: Secondary | ICD-10-CM

## 2022-01-14 MED ORDER — PANTOPRAZOLE SODIUM 40 MG PO TBEC: 40.0000 mg | DELAYED_RELEASE_TABLET | Freq: Every day | ORAL | 3 refills | Status: DC

## 2022-01-14 NOTE — Patient Instructions (Addendum)
It was great seeing you today!  Plan discussed at today's visit: -When you go back to the Hematologist (blood doctor) be sure to tell them that the oral iron tablets are causing GI pain and she cannot tolerate them and she might do better just switching back to the IV iron.  -Start taking iron every other day - take with food, if you take with Vitamin C (orange juice, lemonade, etc.) this can increase absorption but dairy can decrease absorption so avoid this.  -For blood pressure - continue Labetalol 100 twice a day  -Continue to check blood pressure at blood pressure -Stop Omeprazole, start Pantoprazole 40 mg daily  -Right upper   Follow up in: 1 month  Take care and let us know if you have any questions or concerns prior to your next visit.  Dr. Caralee Ates

## 2022-01-14 NOTE — Progress Notes (Signed)
Established Patient Office Visit  Subjective   Patient ID: Candice Walker, female    DOB: [birth year]  Age: 36 y.o. MRN: [id number]  Chief Complaint  Patient presents with   Follow-up   Hypertension   Depression    HPI Candice Walker is a 36 year old female here for follow up on chronic medical conditions. She is here with her friend [name] and with an interpreter to aid in communication.   Abdominal Complaint: -Duration:  since September when gave birth  -Frequency: constant -Nature: bloating and aching -Location: epigastric  -Radiation: no -Alleviating factors: Nothing -Aggravating factors: Eating - any kinds of food -Treatments attempted: PPI -Constipation: yes -Diarrhea: no -Heartburn: yes -Bloating:yes -Passing Gas: yes -Nausea: yes -Vomiting: no -Melena or hematochezia: no  Hypertension: -Medications: Labetalol 100 BID and Amlodipine 2.5 mg started at LOV - did not take Amlodipine because she went to a walk in clinic and they took her off it but she's not sure why. Also got confused and was taking Labetalol 6 times daily for a period of time  -Failed Meds: HCTZ 12.5 mg, caused chest pain and fatigue -Checking BP at home (average): yes; had been as high as 161/100 previously but recently it's been averaging around 130/80.  -Denies any SOB, CP, vision changes, LE edema or symptoms of hypotension  Beta Thalassemia/IDA:  -Following with Hematology, last visit 10/22/21, note reviewed.  -She does have a history of menorrhagia in the past (not currently having cycles due to breastfeeding) and has had to be on IV iron previously. She is currently on oral iron bisglycinate daily but not tolerating them well, causing abdominal discomfort and constipation. Last Hgb 10.8 in March. Plan to recheck labs in June.   GERD: -Currently on Omeprazole 40 mg daily, states it does not help with symptoms   MDD: -Mood status: better -Current treatment: Lexapro 5 mg started at LOV -  stopped taking because she felt dizzy (although she was accidentally also taking Labetalol 6 times a day instead of twice daily) -States moods are better now and she does not want medication      01/14/2022   10:51 AM 12/13/2021   12:58 PM 11/16/2021    3:17 PM 11/16/2021    2:48 PM 03/22/2021    5:01 PM  Depression screen PHQ 2/9  Decreased Interest 0 1 1 0 0  Down, Depressed, Hopeless 0 1 1 0 0  PHQ - 2 Score 0 2 2 0 0  Altered sleeping 0 2 0    Tired, decreased energy 0 3 1    Change in appetite 0 3 0    Feeling bad or failure about yourself  0 0 0    Trouble concentrating 0 2 0    Moving slowly or fidgety/restless 0 1 1    Suicidal thoughts 0 0 0    PHQ-9 Score 0 13 4    Difficult doing work/chores Not difficult at all Somewhat difficult Not difficult at all        Review of Systems  Constitutional:  Negative for chills and fever.  Respiratory:  Negative for shortness of breath.   Cardiovascular:  Negative for chest pain and leg swelling.  Gastrointestinal:  Positive for abdominal pain, constipation, heartburn and nausea. Negative for diarrhea and vomiting.  Neurological:  Negative for headaches.      Objective:     BP 140/84   Pulse 73   Temp 98.1 F (36.7 C)   Resp 16   Ht  5' 0.5" (1.537 m)   Wt 140 lb 1.6 oz (63.5 kg)   SpO2 98%   BMI 26.91 kg/m  BP Readings from Last 3 Encounters:  01/14/22 140/84  12/13/21 132/78  11/16/21 140/88   Wt Readings from Last 3 Encounters:  01/14/22 140 lb 1.6 oz (63.5 kg)  12/13/21 142 lb 4.8 oz (64.5 kg)  11/16/21 141 lb 14.4 oz (64.4 kg)      Physical Exam Constitutional:      Appearance: Normal appearance.  HENT:     Head: Normocephalic and atraumatic.     Mouth/Throat:     Mouth: Mucous membranes are moist.     Pharynx: Oropharynx is clear.  Eyes:     Conjunctiva/sclera: Conjunctivae normal.  Cardiovascular:     Rate and Rhythm: Normal rate and regular rhythm.  Pulmonary:     Effort: Pulmonary effort is  normal.     Breath sounds: Normal breath sounds.  Abdominal:     General: Bowel sounds are normal. There is no distension.     Palpations: Abdomen is soft.     Tenderness: There is abdominal tenderness. There is no right CVA tenderness, left CVA tenderness, guarding or rebound.     Comments: Epigastric and RUQ pain to palpation, Murphy's negative.   Musculoskeletal:     Right lower leg: No edema.     Left lower leg: No edema.  Skin:    General: Skin is warm and dry.  Neurological:     General: No focal deficit present.     Mental Status: She is alert. Mental status is at baseline.  Psychiatric:        Mood and Affect: Mood normal.        Behavior: Behavior normal.      No results found for any visits on 01/14/22.  Last CBC Lab Results  Component Value Date   WBC 7.9 10/22/2021   HGB 10.8 (L) 10/22/2021   HCT 34.8 (L) 10/22/2021   MCV 60.5 (L) 10/22/2021   MCH 18.8 (L) 10/22/2021   RDW 16.1 (H) 10/22/2021   PLT 247 10/22/2021   Last metabolic panel Lab Results  Component Value Date   GLUCOSE 103 (H) 10/22/2021   NA 136 10/22/2021   K 3.9 10/22/2021   CL 106 10/22/2021   CO2 23 10/22/2021   BUN 14 10/22/2021   CREATININE 0.51 10/22/2021   GFRNONAA >60 10/22/2021   CALCIUM 8.6 (L) 10/22/2021   PROT 6.2 (L) 04/28/2021   ALBUMIN 3.1 (L) 04/28/2021   LABGLOB 2.8 02/01/2021   AGRATIO 1.2 02/01/2021   BILITOT 0.8 04/28/2021   ALKPHOS 141 (H) 04/28/2021   AST 19 04/28/2021   ALT 11 04/28/2021   ANIONGAP 7 10/22/2021   Last lipids No results found for: "CHOL", "HDL", "LDLCALC", "LDLDIRECT", "TRIG", "CHOLHDL" Last hemoglobin A1c Lab Results  Component Value Date   HGBA1C 5.4 04/28/2021   Last thyroid functions No results found for: "TSH", "T3TOTAL", "T4TOTAL", "THYROIDAB" Last vitamin D No results found for: "25OHVITD2", "25OHVITD3", "VD25OH" Last vitamin B12 and Folate Lab Results  Component Value Date   VITAMINB12 284 02/01/2021   FOLATE 56.0 02/01/2021       The ASCVD Risk score (Arnett DK, et al., 2019) failed to calculate for the following reasons:   The 2019 ASCVD risk score is only valid for ages 3834 to 3838    Assessment & Plan:   1. Epigastric pain/RUQ pain: I think most of her abdominal issues are secondary to her  oral iron, she states does make the symptoms worse.  We will start having her take this every other day and with vitamin C to help increase absorption.  She will discuss with her hematologist about potentially switching back over to the IV iron.  Abdominal exam today with epigastric and right upper quadrant pain.  We will obtain a right upper quadrant ultrasound for further assessment of her gallbladder.  I will also switch her omeprazole to pantoprazole 40 mg daily for acid reflux.  Follow-up in 1 month for follow-up.  - US Abdomen Limited RUQ (LIVER/GB); Future - pantoprazole (PROTONIX) 40 MG tablet; Take 1 tablet (40 mg total) by mouth daily.  Dispense: 30 tablet; Refill: 3  2. Hypertension, unspecified type: There was some confusion about her medication and doses.  Her blood pressure today was borderline at 140/84.  She has been checking it at home which has been running lower than previously.  In order to decrease confusion, amlodipine will be discontinued.  She will continue her labetalol 100 mg twice daily and continue to check blood pressure at home.  We also discussed staying well-hydrated and decreasing sodium in her diet.  3. Gastroesophageal reflux disease without esophagitis: Switching omeprazole to pantoprazole 40 mg daily.  Recheck at follow-up.  4. Beta thalassemia (HCC)/Iron deficiency anemia, unspecified iron deficiency anemia type: Following up with hematologist later this month for repeat labs.  Discussed how oral iron supplement might be causing some of her GI symptoms and constipation.  Recommend she discuss with her hematologist about potentially switching to IV iron infusions.  5. Mild episode of recurrent  major depressive disorder (HCC): She did not tolerate the Lexapro, however I believe her dizziness was secondary to taking too much of her blood pressure medication.  She states her moods have been stable and she denies any symptoms of depression at this time.  Continue to monitor.   Return in about 4 weeks (around 02/11/2022).    Margarita Mail, DO

## 2022-01-18 ENCOUNTER — Inpatient Hospital Stay: Payer: Medicaid Other | Attending: Nurse Practitioner

## 2022-01-18 ENCOUNTER — Other Ambulatory Visit: Payer: Self-pay

## 2022-01-18 DIAGNOSIS — D649 Anemia, unspecified: Secondary | ICD-10-CM

## 2022-01-18 DIAGNOSIS — Z79899 Other long term (current) drug therapy: Secondary | ICD-10-CM | POA: Insufficient documentation

## 2022-01-18 DIAGNOSIS — D509 Iron deficiency anemia, unspecified: Secondary | ICD-10-CM | POA: Insufficient documentation

## 2022-01-18 DIAGNOSIS — R109 Unspecified abdominal pain: Secondary | ICD-10-CM | POA: Insufficient documentation

## 2022-01-18 LAB — COMPREHENSIVE METABOLIC PANEL
ALT: 16 U/L (ref 0–44)
AST: 21 U/L (ref 15–41)
Albumin: 4.5 g/dL (ref 3.5–5.0)
Alkaline Phosphatase: 97 U/L (ref 38–126)
Anion gap: 9 (ref 5–15)
BUN: 12 mg/dL (ref 6–20)
CO2: 24 mmol/L (ref 22–32)
Calcium: 9.1 mg/dL (ref 8.9–10.3)
Chloride: 104 mmol/L (ref 98–111)
Creatinine, Ser: 0.62 mg/dL (ref 0.44–1.00)
GFR, Estimated: >60 mL/min (ref >60)
Glucose, Bld: 132 mg/dL — ABNORMAL HIGH (ref 70–99)
Potassium: 3.7 mmol/L (ref 3.5–5.1)
Sodium: 137 mmol/L (ref 135–145)
Total Bilirubin: 0.8 mg/dL (ref 0.3–1.2)
Total Protein: 7.7 g/dL (ref 6.5–8.1)

## 2022-01-18 LAB — CBC WITH DIFFERENTIAL/PLATELET
Abs Immature Granulocytes: 0.04 10*3/uL (ref 0.00–0.07)
Basophils Absolute: 0 10*3/uL (ref 0.0–0.1)
Basophils Relative: 1 %
Eosinophils Absolute: 0.2 10*3/uL (ref 0.0–0.5)
Eosinophils Relative: 2 %
HCT: 35.9 % — ABNORMAL LOW (ref 36.0–46.0)
Hemoglobin: 11 g/dL — ABNORMAL LOW (ref 12.0–15.0)
Immature Granulocytes: 1 %
Lymphocytes Relative: 31 %
Lymphs Abs: 2.3 10*3/uL (ref 0.7–4.0)
MCH: 18.6 pg — ABNORMAL LOW (ref 26.0–34.0)
MCHC: 30.6 g/dL (ref 30.0–36.0)
MCV: 60.6 fL — ABNORMAL LOW (ref 80.0–100.0)
Monocytes Absolute: 0.5 10*3/uL (ref 0.1–1.0)
Monocytes Relative: 6 %
Neutro Abs: 4.5 10*3/uL (ref 1.7–7.7)
Neutrophils Relative %: 59 %
Platelets: 246 10*3/uL (ref 150–400)
RBC: 5.92 MIL/uL — ABNORMAL HIGH (ref 3.87–5.11)
RDW: 17.7 % — ABNORMAL HIGH (ref 11.5–15.5)
WBC: 7.4 10*3/uL (ref 4.0–10.5)
nRBC: 0 % (ref 0.0–0.2)

## 2022-01-18 LAB — IRON AND TIBC
Iron: 105 ug/dL (ref 28–170)
Saturation Ratios: 29 % (ref 10.4–31.8)
TIBC: 363 ug/dL (ref 250–450)
UIBC: 258 ug/dL

## 2022-01-18 LAB — FERRITIN: Ferritin: 106 ng/mL (ref 11–307)

## 2022-01-21 ENCOUNTER — Inpatient Hospital Stay: Payer: Medicaid Other

## 2022-01-21 ENCOUNTER — Encounter: Payer: Self-pay | Admitting: Internal Medicine

## 2022-01-21 ENCOUNTER — Inpatient Hospital Stay (HOSPITAL_BASED_OUTPATIENT_CLINIC_OR_DEPARTMENT_OTHER): Payer: Medicaid Other | Admitting: Internal Medicine

## 2022-01-21 DIAGNOSIS — D509 Iron deficiency anemia, unspecified: Secondary | ICD-10-CM | POA: Diagnosis not present

## 2022-01-21 DIAGNOSIS — R109 Unspecified abdominal pain: Secondary | ICD-10-CM | POA: Diagnosis not present

## 2022-01-21 DIAGNOSIS — Z79899 Other long term (current) drug therapy: Secondary | ICD-10-CM | POA: Diagnosis not present

## 2022-01-21 DIAGNOSIS — D649 Anemia, unspecified: Secondary | ICD-10-CM

## 2022-01-21 NOTE — Progress Notes (Signed)
Chest discomfort since last night.  Having some stomach pain and back pain.  Having u/s of abd tomorrow.  Has Pashto interpreter.

## 2022-01-21 NOTE — Assessment & Plan Note (Addendum)
#  Symptomatic anemia-iron deficiency s/p IV iron infusion- Hb improved at 11. JUNE 2023- Iron sat- 26%; Ferritin- 106. Recommend holding IV iron infusion.   # Abdominal pain:? Iron pills vs other.  awaiting [place] with PCP. HOLD off iron pill.    #Etiology likely: Pregnancy/thalassemia- post delivery in oct 2022.  However history of heavy menstrual cycles-might need more IV iron infusion in the future.  Hold off IV iron infusion today.  # DISPOSITION: # HOLD venofer today # in 4 months- follow up with MD- labs-cbc/bmp; iron studies/ferritin-- possible venofer-Dr.B

## 2022-01-21 NOTE — Progress Notes (Signed)
[facility] CONSULT NOTE  Patient Care Team: Margarita Mail, DO as PCP - General (Internal Medicine) Earna Coder, MD as Consulting Physician (Oncology)  CHIEF COMPLAINTS/PURPOSE OF CONSULTATION: Iron deficient anemia  #Iron deficient anemia/thalassemia-pregnancy/delivery end of September 2022.   #History of syncope- s/p monitor cardiac evaluation  HISTORY OF PRESENTING ILLNESS: Ambulating independently.  Accompanied by interpreter/Host.   Candice Walker 36 y.o.  female patient from [place]-with chronic anemia/thalassemia-iron deficiency for pregnancy is here for follow-up.  Patient last received IV iron infusion approximately 4 months ago.  States her energy levels are improved.  She is still nursing; no menstrual cycles yet.  Intermittent abdominal pain question related to her oral iron.  Stopped taking it.  Awaiting ultrasound with her PCP.  Review of Systems  Constitutional:  Positive for malaise/fatigue. Negative for chills, diaphoresis, fever and weight loss.  HENT:  Negative for nosebleeds and sore throat.   Eyes:  Negative for double vision.  Respiratory:  Negative for cough, hemoptysis, sputum production, shortness of breath and wheezing.   Cardiovascular:  Negative for chest pain, palpitations, orthopnea and leg swelling.  Gastrointestinal:  Positive for abdominal pain. Negative for blood in stool, constipation, diarrhea, heartburn, melena, nausea and vomiting.  Genitourinary:  Negative for dysuria, frequency and urgency.  Musculoskeletal:  Negative for back pain and joint pain.  Skin: Negative.  Negative for itching and rash.  Neurological:  Negative for dizziness, tingling, focal weakness, weakness and headaches.  Endo/Heme/Allergies:  Does not bruise/bleed easily.  Psychiatric/Behavioral:  Negative for depression. The patient is not nervous/anxious and does not have insomnia.      MEDICAL HISTORY:  Past Medical History:  Diagnosis  Date   Anemia    Beta thalassemia (HCC)    GERD (gastroesophageal reflux disease)    Gestational diabetes    Hypertension     SURGICAL HISTORY: Past Surgical History:  Procedure Laterality Date   TUBAL LIGATION Bilateral 04/30/2021   Procedure: POST PARTUM TUBAL LIGATION;  Surgeon: Christeen Douglas, MD;  Location: [facility] ORS;  Service: Gynecology;  Laterality: Bilateral;    SOCIAL HISTORY: Social History   Socioeconomic History   Marital status: Married    Spouse name: Yama   Number of children: 6   Years of education: Not on file   Highest education level: Not on file  Occupational History   Not on file  Tobacco Use   Smoking status: Never    Passive exposure: Never   Smokeless tobacco: Never  Vaping Use   Vaping Use: Never used  Substance and Sexual Activity   Alcohol use: Never   Drug use: Never   Sexual activity: Yes    Birth control/protection: None    Comment: planning BTL  Other Topics Concern   Not on file  Social History Narrative   Patient previously lived in [place] and left as refugee. Resided in refugee camp in [place], then in [place] and now lives in [place] with a host family along with her husband and children.    Social Determinants of Health   Financial Resource Strain: Not on file  Food Insecurity: Not on file  Transportation Needs: Not on file  Physical Activity: Not on file  Stress: Not on file  Social Connections: Not on file  Intimate Partner Violence: Not on file    FAMILY HISTORY: Family History  Problem Relation Age of Onset   Hypertension Mother    Diabetes Father    Diabetes Paternal Grandmother     ALLERGIES:  has No Known Allergies.  MEDICATIONS:  Current Outpatient Medications  Medication Sig Dispense Refill   labetalol (NORMODYNE) 100 MG tablet Take 1 tablet (100 mg total) by mouth 2 (two) times daily. 60 tablet 0   Multiple Vitamins-Iron (MULTIVITAMINS WITH IRON) TABS tablet Take 1 tablet by mouth daily.      pantoprazole (PROTONIX) 40 MG tablet Take 1 tablet (40 mg total) by mouth daily. 30 tablet 3   No current facility-administered medications for this visit.      Marland Kitchen  PHYSICAL EXAMINATION:  Vitals:   01/21/22 1303  BP: 119/85  Pulse: 83  Temp: 99.5 F (37.5 C)  SpO2: 100%    Filed Weights   01/21/22 1303  Weight: 141 lb (64 kg)     Physical Exam Vitals and nursing note reviewed.  HENT:     Head: Normocephalic and atraumatic.     Mouth/Throat:     Pharynx: Oropharynx is clear.  Eyes:     Extraocular Movements: Extraocular movements intact.     Pupils: Pupils are equal, round, and reactive to light.  Cardiovascular:     Rate and Rhythm: Normal rate and regular rhythm.  Pulmonary:     Comments: Decreased breath sounds bilaterally.  Abdominal:     Palpations: Abdomen is soft.  Musculoskeletal:        General: Normal range of motion.     Cervical back: Normal range of motion.  Skin:    General: Skin is warm.  Neurological:     General: No focal deficit present.     Mental Status: She is alert and oriented to person, place, and time.  Psychiatric:        Behavior: Behavior normal.        Judgment: Judgment normal.     LABORATORY DATA:  I have reviewed the data as listed Lab Results  Component Value Date   WBC 7.4 01/18/2022   HGB 11.0 (L) 01/18/2022   HCT 35.9 (L) 01/18/2022   MCV 60.6 (L) 01/18/2022   PLT 246 01/18/2022   Recent Labs    02/01/21 1221 04/28/21 0958 10/22/21 1314 01/18/22 1316  NA 131* 136 136 137  K 3.3* 3.5 3.9 3.7  CL 105 106 106 104  CO2 20* 22 23 24   GLUCOSE 126* 76 103* 132*  BUN 11 8 14 12   CREATININE 0.52 0.60 0.51 0.62  CALCIUM 8.6* 8.4* 8.6* 9.1  GFRNONAA >60 >60 >60 >60  PROT 6.8 6.2*  --  7.7  ALBUMIN 3.4* 3.1*  --  4.5  AST 17 19  --  21  ALT 10 11  --  16  ALKPHOS 57 141*  --  97  BILITOT 0.6 0.8  --  0.8    RADIOGRAPHIC STUDIES: I have personally reviewed the radiological images as listed and agreed with the  findings in the report. No results found.  Symptomatic anemia #Symptomatic anemia-iron deficiency s/p IV iron infusion- Hb improved at 11. JUNE 2023- Iron sat- 26%; Ferritin- 106. Recommend holding IV iron infusion.   # Abdominal pain:? Iron pills vs other.  awaiting with PCP. HOLD off iron pill.    #Etiology likely: Pregnancy/thalassemia- post delivery in oct 2022.  However history of heavy menstrual cycles-might need more IV iron infusion in the future.  Hold off IV iron infusion today.  # DISPOSITION: # HOLD venofer today # in 4 months- follow up with MD- labs-cbc/bmp; iron studies/ferritin-- possible venofer-Dr.B    All questions were answered. The patient  knows to call the clinic with any problems, questions or concerns.       Earna Coder, MD 01/21/2022 2:02 PM

## 2022-01-22 ENCOUNTER — Ambulatory Visit
Admission: RE | Admit: 2022-01-22 | Discharge: 2022-01-22 | Disposition: A | Discharge status: Home / Self Care | Payer: Medicaid Other | Source: Ambulatory Visit | Attending: Internal Medicine | Admitting: Internal Medicine

## 2022-01-22 DIAGNOSIS — R1011 Right upper quadrant pain: Secondary | ICD-10-CM | POA: Insufficient documentation

## 2022-01-22 DIAGNOSIS — R1013 Epigastric pain: Secondary | ICD-10-CM | POA: Insufficient documentation

## 2022-02-02 DIAGNOSIS — Z419 Encounter for procedure for purposes other than remedying health state, unspecified: Secondary | ICD-10-CM | POA: Diagnosis not present

## 2022-02-12 NOTE — Progress Notes (Unsigned)
Established Patient Office Visit  Subjective   Patient ID: Candice Walker, female    DOB: [birth year]  Age: 36 y.o. MRN: [id number]  No chief complaint on file.   HPI Candice Walker is a 36 year old female here for follow up on chronic medical conditions. She is here with her friend [name] and with an interpreter to aid in communication.   Abdominal Complaint: -Bloating and distended epigastric pain since September of 2022. -Currently on Protonix 40 mg daily for symptoms (switched from Omeprazole at LOV). -RUQ [place] 01/22/22 showing no cholelithiasis or cholecystitis but increase hepatic parenchymal echogenicity suggestive of steatosis.   Hypertension: -Medications: Labetalol 100 BID. Amlodipine 2.5 mg discontinued. -Failed Meds: HCTZ 12.5 mg, caused chest pain and fatigue -Checking BP at home (average): yes; had been as high as 161/100 previously but recently it's been averaging around 130/80.  -Denies any SOB, CP, vision changes, LE edema or symptoms of hypotension  Beta Thalassemia/IDA:  -Following with Hematology, last visit 10/22/21, note reviewed.  -She does have a history of menorrhagia in the past (not currently having cycles due to breastfeeding) and has had to be on IV iron previously. She is currently on oral iron bisglycinate daily but not tolerating them well, causing abdominal discomfort and constipation. Discussed taking the oral iron every other day at LOV.  -Last Hgb 10.8 in March. Plan to recheck labs in June.   GERD: -Currently on Omeprazole 40 mg daily, states it does not help with symptoms   MDD: -Mood status: better -Current treatment: Nothing currently - had been on Lexapro but made her dizzy  -States moods are better now and she does not want medication      01/14/2022   10:51 AM 12/13/2021   12:58 PM 11/16/2021    3:17 PM 11/16/2021    2:48 PM 03/22/2021    5:01 PM  Depression screen PHQ 2/9  Decreased Interest 0 1 1 0 0  Down, Depressed, Hopeless 0 1 1  0 0  PHQ - 2 Score 0 2 2 0 0  Altered sleeping 0 2 0    Tired, decreased energy 0 3 1    Change in appetite 0 3 0    Feeling bad or failure about yourself  0 0 0    Trouble concentrating 0 2 0    Moving slowly or fidgety/restless 0 1 1    Suicidal thoughts 0 0 0    PHQ-9 Score 0 13 4    Difficult doing work/chores Not difficult at all Somewhat difficult Not difficult at all        Review of Systems  Constitutional:  Negative for chills and fever.  Respiratory:  Negative for shortness of breath.   Cardiovascular:  Negative for chest pain and leg swelling.  Gastrointestinal:  Positive for abdominal pain, constipation, heartburn and nausea. Negative for diarrhea and vomiting.  Neurological:  Negative for headaches.      Objective:     There were no vitals taken for this visit. BP Readings from Last 3 Encounters:  01/21/22 119/85  01/14/22 140/84  12/13/21 132/78   Wt Readings from Last 3 Encounters:  01/21/22 141 lb (64 kg)  01/14/22 140 lb 1.6 oz (63.5 kg)  12/13/21 142 lb 4.8 oz (64.5 kg)      Physical Exam Constitutional:      Appearance: Normal appearance.  HENT:     Head: Normocephalic and atraumatic.     Mouth/Throat:     Mouth: Mucous membranes  are moist.     Pharynx: Oropharynx is clear.  Eyes:     Conjunctiva/sclera: Conjunctivae normal.  Cardiovascular:     Rate and Rhythm: Normal rate and regular rhythm.  Pulmonary:     Effort: Pulmonary effort is normal.     Breath sounds: Normal breath sounds.  Abdominal:     General: Bowel sounds are normal. There is no distension.     Palpations: Abdomen is soft.     Tenderness: There is abdominal tenderness. There is no right CVA tenderness, left CVA tenderness, guarding or rebound.     Comments: Epigastric and RUQ pain to palpation, Murphy's negative.   Musculoskeletal:     Right lower leg: No edema.     Left lower leg: No edema.  Skin:    General: Skin is warm and dry.  Neurological:     General: No  focal deficit present.     Mental Status: She is alert. Mental status is at baseline.  Psychiatric:        Mood and Affect: Mood normal.        Behavior: Behavior normal.      No results found for any visits on 02/13/22.  Last CBC Lab Results  Component Value Date   WBC 7.4 01/18/2022   HGB 11.0 (L) 01/18/2022   HCT 35.9 (L) 01/18/2022   MCV 60.6 (L) 01/18/2022   MCH 18.6 (L) 01/18/2022   RDW 17.7 (H) 01/18/2022   PLT 246 01/18/2022   Last metabolic panel Lab Results  Component Value Date   GLUCOSE 132 (H) 01/18/2022   NA 137 01/18/2022   K 3.7 01/18/2022   CL 104 01/18/2022   CO2 24 01/18/2022   BUN 12 01/18/2022   CREATININE 0.62 01/18/2022   GFRNONAA >60 01/18/2022   CALCIUM 9.1 01/18/2022   PROT 7.7 01/18/2022   ALBUMIN 4.5 01/18/2022   LABGLOB 2.8 02/01/2021   AGRATIO 1.2 02/01/2021   BILITOT 0.8 01/18/2022   ALKPHOS 97 01/18/2022   AST 21 01/18/2022   ALT 16 01/18/2022   ANIONGAP 9 01/18/2022   Last lipids No results found for: "CHOL", "HDL", "LDLCALC", "LDLDIRECT", "TRIG", "CHOLHDL" Last hemoglobin A1c Lab Results  Component Value Date   HGBA1C 5.4 04/28/2021   Last thyroid functions No results found for: "TSH", "T3TOTAL", "T4TOTAL", "THYROIDAB" Last vitamin D No results found for: "25OHVITD2", "25OHVITD3", "VD25OH" Last vitamin B12 and Folate Lab Results  Component Value Date   VITAMINB12 284 02/01/2021   FOLATE 56.0 02/01/2021      The ASCVD Risk score (Arnett DK, et al., 2019) failed to calculate for the following reasons:   The 2019 ASCVD risk score is only valid for ages 17 to 66    Assessment & Plan:   1. Epigastric pain/RUQ pain: I think most of her abdominal issues are secondary to her oral iron, she states does make the symptoms worse.  We will start having her take this every other day and with vitamin C to help increase absorption.  She will discuss with her hematologist about potentially switching back over to the IV iron.   Abdominal exam today with epigastric and right upper quadrant pain.  We will obtain a right upper quadrant ultrasound for further assessment of her gallbladder.  I will also switch her omeprazole to pantoprazole 40 mg daily for acid reflux.  Follow-up in 1 month for follow-up.  - US Abdomen Limited RUQ (LIVER/GB); Future - pantoprazole (PROTONIX) 40 MG tablet; Take 1 tablet (40 mg total)  by mouth daily.  Dispense: 30 tablet; Refill: 3  2. Hypertension, unspecified type: There was some confusion about her medication and doses.  Her blood pressure today was borderline at 140/84.  She has been checking it at home which has been running lower than previously.  In order to decrease confusion, amlodipine will be discontinued.  She will continue her labetalol 100 mg twice daily and continue to check blood pressure at home.  We also discussed staying well-hydrated and decreasing sodium in her diet.  3. Gastroesophageal reflux disease without esophagitis: Switching omeprazole to pantoprazole 40 mg daily.  Recheck at follow-up.  4. Beta thalassemia (HCC)/Iron deficiency anemia, unspecified iron deficiency anemia type: Following up with hematologist later this month for repeat labs.  Discussed how oral iron supplement might be causing some of her GI symptoms and constipation.  Recommend she discuss with her hematologist about potentially switching to IV iron infusions.  5. Mild episode of recurrent major depressive disorder (HCC): She did not tolerate the Lexapro, however I believe her dizziness was secondary to taking too much of her blood pressure medication.  She states her moods have been stable and she denies any symptoms of depression at this time.  Continue to monitor.   No follow-ups on file.    Margarita Mail, DO

## 2022-02-13 ENCOUNTER — Ambulatory Visit: Payer: Medicaid Other | Admitting: Internal Medicine

## 2022-02-13 ENCOUNTER — Encounter: Payer: Self-pay | Admitting: Internal Medicine

## 2022-02-13 VITALS — BP 148/98 | HR 85 | Temp 98.2°F | Resp 16 | Ht 60.5 in | Wt 138.5 lb

## 2022-02-13 DIAGNOSIS — I10 Essential (primary) hypertension: Secondary | ICD-10-CM

## 2022-02-13 DIAGNOSIS — K219 Gastro-esophageal reflux disease without esophagitis: Secondary | ICD-10-CM | POA: Diagnosis not present

## 2022-02-13 DIAGNOSIS — D509 Iron deficiency anemia, unspecified: Secondary | ICD-10-CM | POA: Diagnosis not present

## 2022-02-13 MED ORDER — AMLODIPINE BESYLATE 2.5 MG PO TABS: 2.5000 mg | ORAL_TABLET | Freq: Every day | ORAL | 0 refills | Status: DC

## 2022-02-13 MED ORDER — OMEPRAZOLE 40 MG PO CPDR: 40.0000 mg | DELAYED_RELEASE_CAPSULE | Freq: Every day | ORAL | 3 refills | Status: DC

## 2022-02-13 NOTE — Patient Instructions (Addendum)
It was great seeing you today!  Plan discussed at today's visit: -Blood pressure still high, continue Labetalol 100 mg once in the morning and once at night -New blood pressure medication added - Amlodipine 2.5 mg at night  -Continue to check blood pressure at home -Continue to work on diet change - reduce salt and red meats in the diet. Increase fluids and exercise -Switch from Pantoprazole to Omeprazole   Follow up in: 1 month  Take care and let us know if you have any questions or concerns prior to your next visit.  Dr. Caralee Ates

## 2022-02-15 ENCOUNTER — Encounter: Payer: Self-pay | Admitting: Internal Medicine

## 2022-02-17 ENCOUNTER — Encounter: Payer: Self-pay | Admitting: Internal Medicine

## 2022-02-18 DIAGNOSIS — F411 Generalized anxiety disorder: Secondary | ICD-10-CM | POA: Diagnosis not present

## 2022-02-24 ENCOUNTER — Other Ambulatory Visit: Payer: Self-pay

## 2022-02-24 DIAGNOSIS — Z79899 Other long term (current) drug therapy: Secondary | ICD-10-CM | POA: Insufficient documentation

## 2022-02-24 DIAGNOSIS — R55 Syncope and collapse: Secondary | ICD-10-CM | POA: Diagnosis not present

## 2022-02-24 DIAGNOSIS — I10 Essential (primary) hypertension: Secondary | ICD-10-CM | POA: Insufficient documentation

## 2022-02-24 DIAGNOSIS — Z5321 Procedure and treatment not carried out due to patient leaving prior to being seen by health care provider: Secondary | ICD-10-CM | POA: Diagnosis not present

## 2022-02-24 DIAGNOSIS — R1110 Vomiting, unspecified: Secondary | ICD-10-CM | POA: Diagnosis not present

## 2022-02-24 LAB — URINALYSIS, ROUTINE W REFLEX MICROSCOPIC
Bilirubin Urine: NEGATIVE
Glucose, UA: NEGATIVE mg/dL
Hgb urine dipstick: NEGATIVE
Ketones, ur: NEGATIVE mg/dL
Leukocytes,Ua: NEGATIVE
Nitrite: NEGATIVE
Protein, ur: NEGATIVE mg/dL
Specific Gravity, Urine: 1.004 — ABNORMAL LOW (ref 1.005–1.030)
pH: 5 (ref 5.0–8.0)

## 2022-02-24 LAB — CBC WITH DIFFERENTIAL/PLATELET
Abs Immature Granulocytes: 0.02 10*3/uL (ref 0.00–0.07)
Basophils Absolute: 0.1 10*3/uL (ref 0.0–0.1)
Basophils Relative: 1 %
Eosinophils Absolute: 0.2 10*3/uL (ref 0.0–0.5)
Eosinophils Relative: 3 %
HCT: 34.4 % — ABNORMAL LOW (ref 36.0–46.0)
Hemoglobin: 10.3 g/dL — ABNORMAL LOW (ref 12.0–15.0)
Immature Granulocytes: 0 %
Lymphocytes Relative: 35 %
Lymphs Abs: 2.5 10*3/uL (ref 0.7–4.0)
MCH: 18.1 pg — ABNORMAL LOW (ref 26.0–34.0)
MCHC: 29.9 g/dL — ABNORMAL LOW (ref 30.0–36.0)
MCV: 60.5 fL — ABNORMAL LOW (ref 80.0–100.0)
Monocytes Absolute: 0.5 10*3/uL (ref 0.1–1.0)
Monocytes Relative: 7 %
Neutro Abs: 3.9 10*3/uL (ref 1.7–7.7)
Neutrophils Relative %: 54 %
Platelets: 254 10*3/uL (ref 150–400)
RBC: 5.69 MIL/uL — ABNORMAL HIGH (ref 3.87–5.11)
RDW: 16 % — ABNORMAL HIGH (ref 11.5–15.5)
WBC: 7.2 10*3/uL (ref 4.0–10.5)
nRBC: 0 % (ref 0.0–0.2)

## 2022-02-24 LAB — COMPREHENSIVE METABOLIC PANEL
ALT: 14 U/L (ref 0–44)
AST: 16 U/L (ref 15–41)
Albumin: 4.6 g/dL (ref 3.5–5.0)
Alkaline Phosphatase: 103 U/L (ref 38–126)
Anion gap: 7 (ref 5–15)
BUN: 11 mg/dL (ref 6–20)
CO2: 26 mmol/L (ref 22–32)
Calcium: 9.5 mg/dL (ref 8.9–10.3)
Chloride: 106 mmol/L (ref 98–111)
Creatinine, Ser: 0.57 mg/dL (ref 0.44–1.00)
GFR, Estimated: >60 mL/min (ref >60)
Glucose, Bld: 121 mg/dL — ABNORMAL HIGH (ref 70–99)
Potassium: 3.6 mmol/L (ref 3.5–5.1)
Sodium: 139 mmol/L (ref 135–145)
Total Bilirubin: 0.7 mg/dL (ref 0.3–1.2)
Total Protein: 7.7 g/dL (ref 6.5–8.1)

## 2022-02-24 LAB — POC URINE PREG, ED: Preg Test, Ur: NEGATIVE

## 2022-02-24 LAB — TROPONIN I (HIGH SENSITIVITY): Troponin I (High Sensitivity): <2 ng/L (ref <18)

## 2022-02-24 NOTE — ED Triage Notes (Addendum)
Video interpreter in use.  Pt to ED via wheelchair with family. Pt husband c/o hypertension. BP 180 systolic at home. Syncopal episode and 1 episode of vomiting tonight. Reports pt has had several syncopal episodes recently Currently taking BP meds Pt pale in color in triage. Denies chest pain.

## 2022-02-25 ENCOUNTER — Emergency Department
Admission: EM | Admit: 2022-02-25 | Discharge: 2022-02-25 | Discharge status: Against Medical Advice | Payer: Medicaid Other | Attending: Emergency Medicine | Admitting: Emergency Medicine

## 2022-02-25 DIAGNOSIS — F411 Generalized anxiety disorder: Secondary | ICD-10-CM | POA: Diagnosis not present

## 2022-02-25 LAB — TROPONIN I (HIGH SENSITIVITY): Troponin I (High Sensitivity): <2 ng/L

## 2022-03-02 DIAGNOSIS — Z0389 Encounter for observation for other suspected diseases and conditions ruled out: Secondary | ICD-10-CM | POA: Diagnosis not present

## 2022-03-02 DIAGNOSIS — I10 Essential (primary) hypertension: Secondary | ICD-10-CM | POA: Diagnosis not present

## 2022-03-02 DIAGNOSIS — Z20818 Contact with and (suspected) exposure to other bacterial communicable diseases: Secondary | ICD-10-CM | POA: Diagnosis not present

## 2022-03-02 DIAGNOSIS — A491 Streptococcal infection, unspecified site: Secondary | ICD-10-CM | POA: Diagnosis not present

## 2022-03-02 DIAGNOSIS — Z20828 Contact with and (suspected) exposure to other viral communicable diseases: Secondary | ICD-10-CM | POA: Diagnosis not present

## 2022-03-02 DIAGNOSIS — T461X5A Adverse effect of calcium-channel blockers, initial encounter: Secondary | ICD-10-CM | POA: Diagnosis not present

## 2022-03-02 DIAGNOSIS — A492 Hemophilus influenzae infection, unspecified site: Secondary | ICD-10-CM | POA: Diagnosis not present

## 2022-03-05 DIAGNOSIS — Z419 Encounter for procedure for purposes other than remedying health state, unspecified: Secondary | ICD-10-CM | POA: Diagnosis not present

## 2022-03-12 DIAGNOSIS — F411 Generalized anxiety disorder: Secondary | ICD-10-CM | POA: Diagnosis not present

## 2022-03-18 ENCOUNTER — Ambulatory Visit: Payer: Medicaid Other | Admitting: Internal Medicine

## 2022-04-03 ENCOUNTER — Telehealth: Payer: Self-pay | Admitting: Internal Medicine

## 2022-04-03 NOTE — Telephone Encounter (Signed)
Patient's friend called to report that patient has been experiencing extreme fatigue and lethargy. Her next appointment is not until October 24th.   Please advise

## 2022-04-05 ENCOUNTER — Other Ambulatory Visit: Payer: Self-pay

## 2022-04-05 DIAGNOSIS — D649 Anemia, unspecified: Secondary | ICD-10-CM

## 2022-04-05 DIAGNOSIS — Z419 Encounter for procedure for purposes other than remedying health state, unspecified: Secondary | ICD-10-CM | POA: Diagnosis not present

## 2022-04-05 NOTE — Telephone Encounter (Signed)
Labs entered.

## 2022-04-11 ENCOUNTER — Other Ambulatory Visit: Payer: Self-pay

## 2022-04-11 DIAGNOSIS — D649 Anemia, unspecified: Secondary | ICD-10-CM

## 2022-04-12 ENCOUNTER — Encounter: Payer: Self-pay | Admitting: Medical Oncology

## 2022-04-12 ENCOUNTER — Inpatient Hospital Stay: Payer: Medicaid Other | Attending: Nurse Practitioner

## 2022-04-12 ENCOUNTER — Inpatient Hospital Stay (HOSPITAL_BASED_OUTPATIENT_CLINIC_OR_DEPARTMENT_OTHER): Payer: Medicaid Other | Admitting: Medical Oncology

## 2022-04-12 ENCOUNTER — Inpatient Hospital Stay: Payer: Medicaid Other

## 2022-04-12 VITALS — BP 127/84 | HR 82 | Temp 98.4°F

## 2022-04-12 VITALS — BP 134/81 | HR 80 | Temp 98.7°F | Resp 20 | Wt 139.2 lb

## 2022-04-12 DIAGNOSIS — D649 Anemia, unspecified: Secondary | ICD-10-CM

## 2022-04-12 DIAGNOSIS — D509 Iron deficiency anemia, unspecified: Secondary | ICD-10-CM | POA: Diagnosis not present

## 2022-04-12 DIAGNOSIS — D561 Beta thalassemia: Secondary | ICD-10-CM | POA: Insufficient documentation

## 2022-04-12 DIAGNOSIS — R4589 Other symptoms and signs involving emotional state: Secondary | ICD-10-CM

## 2022-04-12 DIAGNOSIS — O99019 Anemia complicating pregnancy, unspecified trimester: Secondary | ICD-10-CM

## 2022-04-12 LAB — IRON AND TIBC
Iron: 79 ug/dL (ref 28–170)
Saturation Ratios: 23 % (ref 10.4–31.8)
TIBC: 349 ug/dL (ref 250–450)
UIBC: 270 ug/dL

## 2022-04-12 LAB — CBC WITH DIFFERENTIAL/PLATELET
Abs Immature Granulocytes: 0.02 10*3/uL (ref 0.00–0.07)
Basophils Absolute: 0.1 10*3/uL (ref 0.0–0.1)
Basophils Relative: 1 %
Eosinophils Absolute: 0.2 10*3/uL (ref 0.0–0.5)
Eosinophils Relative: 2 %
HCT: 34.5 % — ABNORMAL LOW (ref 36.0–46.0)
Hemoglobin: 10.6 g/dL — ABNORMAL LOW (ref 12.0–15.0)
Immature Granulocytes: 0 %
Lymphocytes Relative: 29 %
Lymphs Abs: 2.1 10*3/uL (ref 0.7–4.0)
MCH: 18.6 pg — ABNORMAL LOW (ref 26.0–34.0)
MCHC: 30.7 g/dL (ref 30.0–36.0)
MCV: 60.6 fL — ABNORMAL LOW (ref 80.0–100.0)
Monocytes Absolute: 0.6 10*3/uL (ref 0.1–1.0)
Monocytes Relative: 8 %
Neutro Abs: 4.4 10*3/uL (ref 1.7–7.7)
Neutrophils Relative %: 60 %
Platelets: 212 10*3/uL (ref 150–400)
RBC: 5.69 MIL/uL — ABNORMAL HIGH (ref 3.87–5.11)
RDW: 16.3 % — ABNORMAL HIGH (ref 11.5–15.5)
WBC: 7.3 10*3/uL (ref 4.0–10.5)
nRBC: 0 % (ref 0.0–0.2)

## 2022-04-12 LAB — COMPREHENSIVE METABOLIC PANEL
ALT: 15 U/L (ref 0–44)
AST: 19 U/L (ref 15–41)
Albumin: 4.5 g/dL (ref 3.5–5.0)
Alkaline Phosphatase: 93 U/L (ref 38–126)
Anion gap: 6 (ref 5–15)
BUN: 12 mg/dL (ref 6–20)
CO2: 26 mmol/L (ref 22–32)
Calcium: 8.8 mg/dL — ABNORMAL LOW (ref 8.9–10.3)
Chloride: 106 mmol/L (ref 98–111)
Creatinine, Ser: 0.66 mg/dL (ref 0.44–1.00)
GFR, Estimated: >60 mL/min (ref >60)
Glucose, Bld: 138 mg/dL — ABNORMAL HIGH (ref 70–99)
Potassium: 3.5 mmol/L (ref 3.5–5.1)
Sodium: 138 mmol/L (ref 135–145)
Total Bilirubin: 0.4 mg/dL (ref 0.3–1.2)
Total Protein: 7.4 g/dL (ref 6.5–8.1)

## 2022-04-12 LAB — FERRITIN: Ferritin: 86 ng/mL (ref 11–307)

## 2022-04-12 MED ORDER — SODIUM CHLORIDE 0.9 % IV SOLN
Freq: Once | INTRAVENOUS | Status: AC
  Filled 2022-04-12: qty 250

## 2022-04-12 MED ORDER — SODIUM CHLORIDE 0.9 % IV SOLN
200.0000 mg | Freq: Once | INTRAVENOUS | Status: AC
  Administered 2022-04-12: 200 mg via INTRAVENOUS
  Filled 2022-04-12: qty 200

## 2022-04-12 NOTE — Progress Notes (Signed)
[facility] CONSULT NOTE  Patient Care Team: Margarita Mail, DO as PCP - General (Internal Medicine) Earna Coder, MD as Consulting Physician (Oncology)  CHIEF COMPLAINTS/PURPOSE OF CONSULTATION: Iron deficient anemia  #Iron deficient anemia/thalassemia-pregnancy/delivery end of September 2022.   #History of syncope- s/p monitor cardiac evaluation  HISTORY OF PRESENTING ILLNESS: Ambulating independently.  Accompanied by interpreter/Host.   Candice Walker 36 y.o.  female patient from [place]-with chronic anemia/thalassemia-iron deficiency for pregnancy is here for follow-up.  Patient last received IV iron infusion approximately 4 months ago. She felt much better after her IV iron infusion with improved energy. Now feeling tired again. Strongly believes her iron levels are low. She is nursing, menstrual cycles are regular. Not taking oral iron as it causes GI distress.   Reports that she is working with her PCP regarding depressive symptoms. Denies SI/HI. Will schedule follow up to further discuss this.   Review of Systems  Constitutional:  Positive for malaise/fatigue. Negative for chills, diaphoresis, fever and weight loss.  HENT:  Negative for nosebleeds and sore throat.   Eyes:  Negative for double vision.  Respiratory:  Negative for cough, hemoptysis, sputum production, shortness of breath and wheezing.   Cardiovascular:  Negative for chest pain, palpitations, orthopnea and leg swelling.  Gastrointestinal:  Negative for abdominal pain, blood in stool, constipation, diarrhea, heartburn, melena, nausea and vomiting.  Genitourinary:  Negative for dysuria, frequency and urgency.  Musculoskeletal:  Negative for back pain and joint pain.  Skin: Negative.  Negative for itching and rash.  Neurological:  Negative for dizziness, tingling, focal weakness, weakness and headaches.  Endo/Heme/Allergies:  Does not bruise/bleed easily.  Psychiatric/Behavioral:   Negative for depression. The patient is not nervous/anxious and does not have insomnia.      MEDICAL HISTORY:  Past Medical History:  Diagnosis Date   Anemia    Beta thalassemia (HCC)    GERD (gastroesophageal reflux disease)    Gestational diabetes    Hypertension     SURGICAL HISTORY: Past Surgical History:  Procedure Laterality Date   TUBAL LIGATION Bilateral 04/30/2021   Procedure: POST PARTUM TUBAL LIGATION;  Surgeon: Christeen Douglas, MD;  Location: [facility] ORS;  Service: Gynecology;  Laterality: Bilateral;    SOCIAL HISTORY: Social History   Socioeconomic History   Marital status: Married    Spouse name: Yama   Number of children: 6   Years of education: Not on file   Highest education level: Not on file  Occupational History   Not on file  Tobacco Use   Smoking status: Never    Passive exposure: Never   Smokeless tobacco: Never  Vaping Use   Vaping Use: Never used  Substance and Sexual Activity   Alcohol use: Never   Drug use: Never   Sexual activity: Yes    Birth control/protection: None    Comment: planning BTL  Other Topics Concern   Not on file  Social History Narrative   Patient previously lived in [place] and left as refugee. Resided in refugee camp in [place], then in [place] and now lives in [place] with a host family along with her husband and children.    Social Determinants of Health   Financial Resource Strain: Not on file  Food Insecurity: Not on file  Transportation Needs: Not on file  Physical Activity: Not on file  Stress: Not on file  Social Connections: Not on file  Intimate Partner Violence: Not on file    FAMILY HISTORY: Family History  Problem Relation Age of Onset   Hypertension Mother    Diabetes Father    Diabetes Paternal Grandmother     ALLERGIES:  has No Known Allergies.  MEDICATIONS:  Current Outpatient Medications  Medication Sig Dispense Refill   cetirizine (ZYRTEC) 10 MG chewable tablet Chew 10 mg by mouth  daily.     dicyclomine (BENTYL) 20 MG tablet Take 20 mg by mouth 2 (two) times daily.     FEROSUL 325 (65 Fe) MG tablet Take by mouth.     gabapentin (NEURONTIN) 300 MG capsule Take 300 mg by mouth 3 (three) times daily.     labetalol (NORMODYNE) 100 MG tablet Take 1 tablet (100 mg total) by mouth 2 (two) times daily. 60 tablet 0   Multiple Vitamins-Iron (MULTIVITAMINS WITH IRON) TABS tablet Take 1 tablet by mouth daily.     omeprazole (PRILOSEC) 20 MG capsule Take 20 mg by mouth daily.     omeprazole (PRILOSEC) 40 MG capsule Take 1 capsule (40 mg total) by mouth daily. 30 capsule 3   amLODipine (NORVASC) 2.5 MG tablet Take 1 tablet (2.5 mg total) by mouth daily. 90 tablet 0   No current facility-administered medications for this visit.      Marland Kitchen  PHYSICAL EXAMINATION:  Vitals:   04/12/22 1443  BP: 134/81  Pulse: 80  Resp: 20  Temp: 98.7 F (37.1 C)  SpO2: 98%    Filed Weights   04/12/22 1443  Weight: 139 lb 3.2 oz (63.1 kg)     Physical Exam Vitals and nursing note reviewed.  HENT:     Head: Normocephalic and atraumatic.     Mouth/Throat:     Pharynx: Oropharynx is clear.  Eyes:     Extraocular Movements: Extraocular movements intact.     Pupils: Pupils are equal, round, and reactive to light.  Cardiovascular:     Rate and Rhythm: Normal rate and regular rhythm.  Pulmonary:     Comments: Decreased breath sounds bilaterally.  Abdominal:     Palpations: Abdomen is soft.  Musculoskeletal:        General: Normal range of motion.     Cervical back: Normal range of motion.  Skin:    General: Skin is warm.  Neurological:     General: No focal deficit present.     Mental Status: She is alert and oriented to person, place, and time.  Psychiatric:        Behavior: Behavior normal.        Judgment: Judgment normal.     LABORATORY DATA:  I have reviewed the data as listed Lab Results  Component Value Date   WBC 7.3 04/12/2022   HGB 10.6 (L) 04/12/2022   HCT 34.5  (L) 04/12/2022   MCV 60.6 (L) 04/12/2022   PLT 212 04/12/2022   Recent Labs    01/18/22 1316 02/24/22 2203 04/12/22 1422  NA 137 139 138  K 3.7 3.6 3.5  CL 104 106 106  CO2 24 26 26   GLUCOSE 132* 121* 138*  BUN 12 11 12   CREATININE 0.62 0.57 0.66  CALCIUM 9.1 9.5 8.8*  GFRNONAA >60 >60 >60  PROT 7.7 7.7 7.4  ALBUMIN 4.5 4.6 4.5  AST 21 16 19   ALT 16 14 15   ALKPHOS 97 103 93  BILITOT 0.8 0.7 0.4    RADIOGRAPHIC STUDIES: I have personally reviewed the radiological images as listed and agreed with the findings in the report. No results found.  No problem-specific Assessment &  Plan notes found for this encounter.  Symptomatic anemia #Symptomatic anemia-iron deficiency /thalassemia s/p IV iron infusion: Hgb 10.6, microcytic. Iron stores pending. I advised to wait for Iron/TIBC to return before proceeding forward with IV iron. Patient and host strongly requested IV iron as she felt better after this previously and have significant transportation concerns for returning within the near future. Discussed risks. Proceed forward with one dose only until labs result.   We also had a long discussion regarding her mental health and how her fatigue may also be secondary to this. I have recommended that she have close follow up with her PCP which she is agreeable with.    # DISPOSITION: Venofer today- PT request.  # in 4 months- follow up with MD- labs-cbc/bmp; iron studies/ferritin-- possible venofer  All questions were answered. The patient knows to call the clinic with any problems, questions or concerns.       Rushie Chestnut, PA-C 04/12/2022 4:24 PM

## 2022-05-05 DIAGNOSIS — Z419 Encounter for procedure for purposes other than remedying health state, unspecified: Secondary | ICD-10-CM | POA: Diagnosis not present

## 2022-05-11 DIAGNOSIS — K2970 Gastritis, unspecified, without bleeding: Secondary | ICD-10-CM | POA: Diagnosis not present

## 2022-05-11 DIAGNOSIS — M542 Cervicalgia: Secondary | ICD-10-CM | POA: Diagnosis not present

## 2022-05-11 DIAGNOSIS — F419 Anxiety disorder, unspecified: Secondary | ICD-10-CM | POA: Diagnosis not present

## 2022-05-11 DIAGNOSIS — I10 Essential (primary) hypertension: Secondary | ICD-10-CM | POA: Diagnosis not present

## 2022-05-27 MED FILL — Iron Sucrose Inj 20 MG/ML (Fe Equiv): INTRAVENOUS | Qty: 10 | Status: AC

## 2022-05-28 ENCOUNTER — Inpatient Hospital Stay: Payer: Medicaid Other | Attending: Nurse Practitioner | Admitting: Internal Medicine

## 2022-05-28 ENCOUNTER — Inpatient Hospital Stay: Payer: Medicaid Other

## 2022-05-28 DIAGNOSIS — D649 Anemia, unspecified: Secondary | ICD-10-CM

## 2022-05-28 DIAGNOSIS — D561 Beta thalassemia: Secondary | ICD-10-CM | POA: Insufficient documentation

## 2022-05-28 DIAGNOSIS — K219 Gastro-esophageal reflux disease without esophagitis: Secondary | ICD-10-CM | POA: Diagnosis not present

## 2022-05-28 DIAGNOSIS — I10 Essential (primary) hypertension: Secondary | ICD-10-CM | POA: Diagnosis not present

## 2022-05-28 DIAGNOSIS — D509 Iron deficiency anemia, unspecified: Secondary | ICD-10-CM | POA: Insufficient documentation

## 2022-05-28 DIAGNOSIS — Z79899 Other long term (current) drug therapy: Secondary | ICD-10-CM | POA: Diagnosis not present

## 2022-05-28 DIAGNOSIS — G629 Polyneuropathy, unspecified: Secondary | ICD-10-CM | POA: Diagnosis not present

## 2022-05-28 LAB — BASIC METABOLIC PANEL
Anion gap: 7 (ref 5–15)
BUN: 8 mg/dL (ref 6–20)
CO2: 24 mmol/L (ref 22–32)
Calcium: 8.9 mg/dL (ref 8.9–10.3)
Chloride: 108 mmol/L (ref 98–111)
Creatinine, Ser: 0.46 mg/dL (ref 0.44–1.00)
GFR, Estimated: >60 mL/min (ref >60)
Glucose, Bld: 139 mg/dL — ABNORMAL HIGH (ref 70–99)
Potassium: 3.6 mmol/L (ref 3.5–5.1)
Sodium: 139 mmol/L (ref 135–145)

## 2022-05-28 LAB — CBC WITH DIFFERENTIAL/PLATELET
Abs Immature Granulocytes: 0.04 10*3/uL (ref 0.00–0.07)
Basophils Absolute: 0.1 10*3/uL (ref 0.0–0.1)
Basophils Relative: 1 %
Eosinophils Absolute: 0.2 10*3/uL (ref 0.0–0.5)
Eosinophils Relative: 3 %
HCT: 35.3 % — ABNORMAL LOW (ref 36.0–46.0)
Hemoglobin: 10.9 g/dL — ABNORMAL LOW (ref 12.0–15.0)
Immature Granulocytes: 1 %
Lymphocytes Relative: 22 %
Lymphs Abs: 1.9 10*3/uL (ref 0.7–4.0)
MCH: 18.7 pg — ABNORMAL LOW (ref 26.0–34.0)
MCHC: 30.9 g/dL (ref 30.0–36.0)
MCV: 60.7 fL — ABNORMAL LOW (ref 80.0–100.0)
Monocytes Absolute: 0.5 10*3/uL (ref 0.1–1.0)
Monocytes Relative: 6 %
Neutro Abs: 6 10*3/uL (ref 1.7–7.7)
Neutrophils Relative %: 67 %
Platelets: 215 10*3/uL (ref 150–400)
RBC: 5.82 MIL/uL — ABNORMAL HIGH (ref 3.87–5.11)
RDW: 17.2 % — ABNORMAL HIGH (ref 11.5–15.5)
WBC: 8.8 10*3/uL (ref 4.0–10.5)
nRBC: 0 % (ref 0.0–0.2)

## 2022-05-28 LAB — FERRITIN: Ferritin: 135 ng/mL (ref 11–307)

## 2022-05-28 LAB — IRON AND TIBC
Iron: 40 ug/dL (ref 28–170)
Saturation Ratios: 12 % (ref 10.4–31.8)
TIBC: 343 ug/dL (ref 250–450)
UIBC: 303 ug/dL

## 2022-05-28 NOTE — Progress Notes (Signed)
[facility] CON[facility]  Patient Care Team: Margarita Mail, DO as PCP - General (Internal Medicine) Earna Coder, MD as Consulting Physician (Oncology)  CHIEF COMPLAINTS/PURPOSE OF CONSULTATION: Iron deficient anemia  #Iron deficient anemia/thalassemia-pregnancy/delivery end of September 2022.   #History of syncope- s/p monitor cardiac evaluation  HISTORY OF PRESENTING ILLNESS: Ambulating independently.  Accompanied by interpreter/Host.   Candice Walker 36 y.o.  female patient from [place]-with chronic anemia/thalassemia-iron deficiency-- pregnancy [June 2022] here for follow-up.  Patient is currently not menstruating.  She is breast-feeding.  She is taking oral iron 3-4 times a week.  Patient states she is having some numbness and tingly in her hands- for last 10 years.   Review of Systems  Constitutional:  Positive for malaise/fatigue. Negative for chills, diaphoresis, fever and weight loss.  HENT:  Negative for nosebleeds and sore throat.   Eyes:  Negative for double vision.  Respiratory:  Negative for cough, hemoptysis, sputum production, shortness of breath and wheezing.   Cardiovascular:  Negative for chest pain, palpitations, orthopnea and leg swelling.  Gastrointestinal:  Negative for abdominal pain, blood in stool, constipation, diarrhea, heartburn, melena, nausea and vomiting.  Genitourinary:  Negative for dysuria, frequency and urgency.  Musculoskeletal:  Negative for back pain and joint pain.  Skin: Negative.  Negative for itching and rash.  Neurological:  Negative for dizziness, tingling, focal weakness, weakness and headaches.  Endo/Heme/Allergies:  Does not bruise/bleed easily.  Psychiatric/Behavioral:  Negative for depression. The patient is not nervous/anxious and does not have insomnia.      MEDICAL HISTORY:  Past Medical History:  Diagnosis Date   Anemia    Beta thalassemia (HCC)    GERD (gastroesophageal reflux disease)     Gestational diabetes    Hypertension     SURGICAL HISTORY: Past Surgical History:  Procedure Laterality Date   TUBAL LIGATION Bilateral 04/30/2021   Procedure: POST PARTUM TUBAL LIGATION;  Surgeon: Christeen Douglas, MD;  Location: [facility] ORS;  Service: Gynecology;  Laterality: Bilateral;    SOCIAL HISTORY: Social History   Socioeconomic History   Marital status: Married    Spouse name: Yama   Number of children: 6   Years of education: Not on file   Highest education level: Not on file  Occupational History   Not on file  Tobacco Use   Smoking status: Never    Passive exposure: Never   Smokeless tobacco: Never  Vaping Use   Vaping Use: Never used  Substance and Sexual Activity   Alcohol use: Never   Drug use: Never   Sexual activity: Yes    Birth control/protection: None    Comment: planning BTL  Other Topics Concern   Not on file  Social History Narrative   Patient previously lived in [place] and left as refugee. Resided in refugee camp in [place], then in [place] and now lives in [place] with a host family along with her husband and children.    Social Determinants of Health   Financial Resource Strain: Not on file  Food Insecurity: Not on file  Transportation Needs: Not on file  Physical Activity: Not on file  Stress: Not on file  Social Connections: Not on file  Intimate Partner Violence: Not on file    FAMILY HISTORY: Family History  Problem Relation Age of Onset   Hypertension Mother    Diabetes Father    Diabetes Paternal Grandmother     ALLERGIES:  has No Known Allergies.  MEDICATIONS:  Current Outpatient Medications  Medication Sig Dispense Refill   cetirizine (ZYRTEC) 10 MG chewable tablet Chew 10 mg by mouth daily.     dicyclomine (BENTYL) 20 MG tablet Take 20 mg by mouth 2 (two) times daily.     escitalopram (LEXAPRO) 10 MG tablet Take 10 mg by mouth daily.     FEROSUL 325 (65 Fe) MG tablet Take by mouth.     gabapentin (NEURONTIN) 300 MG  capsule Take 300 mg by mouth 3 (three) times daily.     labetalol (NORMODYNE) 100 MG tablet Take 1 tablet (100 mg total) by mouth 2 (two) times daily. 60 tablet 0   omeprazole (PRILOSEC) 40 MG capsule Take 1 capsule (40 mg total) by mouth daily. 30 capsule 3   amLODipine (NORVASC) 2.5 MG tablet Take 1 tablet (2.5 mg total) by mouth daily. 90 tablet 0   Multiple Vitamins-Iron (MULTIVITAMINS WITH IRON) TABS tablet Take 1 tablet by mouth daily. (Patient not taking: Reported on 05/28/2022)     omeprazole (PRILOSEC) 20 MG capsule Take 20 mg by mouth daily. (Patient not taking: Reported on 05/28/2022)     No current facility-administered medications for this visit.      Marland Kitchen  PHYSICAL EXAMINATION:  Vitals:   05/28/22 1332  BP: 128/80  Pulse: 80  Resp: 20  Temp: (!) 96.6 F (35.9 C)  SpO2: 100%    Filed Weights   05/28/22 1332  Weight: 138 lb 3.2 oz (62.7 kg)     Physical Exam Vitals and nursing note reviewed.  HENT:     Head: Normocephalic and atraumatic.     Mouth/Throat:     Pharynx: Oropharynx is clear.  Eyes:     Extraocular Movements: Extraocular movements intact.     Pupils: Pupils are equal, round, and reactive to light.  Cardiovascular:     Rate and Rhythm: Normal rate and regular rhythm.  Pulmonary:     Comments: Decreased breath sounds bilaterally.  Abdominal:     Palpations: Abdomen is soft.  Musculoskeletal:        General: Normal range of motion.     Cervical back: Normal range of motion.  Skin:    General: Skin is warm.  Neurological:     General: No focal deficit present.     Mental Status: She is alert and oriented to person, place, and time.  Psychiatric:        Behavior: Behavior normal.        Judgment: Judgment normal.     LABORATORY DATA:  I have reviewed the data as listed Lab Results  Component Value Date   WBC 8.8 05/28/2022   HGB 10.9 (L) 05/28/2022   HCT 35.3 (L) 05/28/2022   MCV 60.7 (L) 05/28/2022   PLT 215 05/28/2022   Recent  Labs    01/18/22 1316 02/24/22 2203 04/12/22 1422 05/28/22 1314  NA 137 139 138 139  K 3.7 3.6 3.5 3.6  CL 104 106 106 108  CO2 24 26 26 24   GLUCOSE 132* 121* 138* 139*  BUN 12 11 12 8   CREATININE 0.62 0.57 0.66 0.46  CALCIUM 9.1 9.5 8.8* 8.9  GFRNONAA >60 >60 >60 >60  PROT 7.7 7.7 7.4  --   ALBUMIN 4.5 4.6 4.5  --   AST 21 16 19   --   ALT 16 14 15   --   ALKPHOS 97 103 93  --   BILITOT 0.8 0.7 0.4  --     RADIOGRAPHIC STUDIES: I have personally reviewed the  radiological images as listed and agreed with the findings in the report. No results found.  Symptomatic anemia #Symptomatic anemia-multifactorial iron deficiency [thalassemia/menstrual]- ]iron deficiency s/p IV iron infusion- Hb improved at 11. SEP 2023- 2023- Iron sat- 26%; Ferritin- 106. Recommend holding IV iron infusion. Continue gentle iron.   # HOLD off Iron infusion. Continue Po iron every other day [GI distress]  # Etiology likely: Pregnancy/thalassemia- post delivery in oct 2022.  However history of heavy menstrual cycles-might need more IV iron infusion in the future.  Hold off IV iron infusion today.  # Abdominal pain:? Iron pills vs other. JUNE 2023- [place] - fatty liver- STABLE.   # DISPOSITION: # Interpreter # HOLD venofer today # in 4 months- follow up with MD- labs-cbc/bmp; iron studies/ferritin-- possible venofer-Dr.B  Symptomatic anemia #Symptomatic anemia-iron deficiency /thalassemia s/p IV iron infusion: Hgb 10.6, microcytic. Iron stores pending. I advised to wait for Iron/TIBC to return before proceeding forward with IV iron. Patient and host strongly requested IV iron as she felt better after this previously and have significant transportation concerns for returning within the near future. Discussed risks. Proceed forward with one dose only until labs result.   We also had a long discussion regarding her mental health and how her fatigue may also be secondary to this. I have recommended that she have  close follow up with her PCP which she is agreeable with.    # DISPOSITION: Venofer today- PT request.  # in 4 months- follow up with MD- labs-cbc/bmp; iron studies/ferritin-- possible venofer  All questions were answered. The patient knows to call the clinic with any problems, questions or concerns.       Cammie Sickle, MD 05/28/2022 2:15 PM

## 2022-05-28 NOTE — Assessment & Plan Note (Addendum)
#  Symptomatic anemia-multifactorial iron deficiency [thalassemia/menstrual]- ]iron deficiency s/p IV iron infusion- Hb improved at 11. SEP 2023- 2023- Iron sat- 26%; Ferritin- 106. Recommend holding IV iron infusion. Continue gentle iron.   # HOLD off Iron infusion. Continue Po iron every other day [GI distress]  # Etiology likely: Pregnancy/thalassemia- post delivery in oct 2022.  However history of heavy menstrual cycles-might need more IV iron infusion in the future.  Hold off IV iron infusion today.  # Abdominal pain:? Iron pills vs other. JUNE 2023- [place] - fatty liver- STABLE.   # DISPOSITION: # Interpreter # HOLD venofer today # in 4 months- follow up with MD- labs-cbc/bmp; iron studies/ferritin-- possible venofer-Dr.B

## 2022-05-28 NOTE — Progress Notes (Signed)
Patient states she is having some numbness and tingly in her hands. Patient states she feels like "Something is moving under my scalp skin, like ants or something".

## 2022-07-19 ENCOUNTER — Ambulatory Visit: Payer: Medicaid Other | Admitting: Internal Medicine

## 2022-07-19 ENCOUNTER — Other Ambulatory Visit: Payer: Medicaid Other

## 2022-09-03 ENCOUNTER — Telehealth: Payer: Self-pay

## 2022-09-03 NOTE — Progress Notes (Signed)
..  Medicaid Managed Care   Unsuccessful Outreach Note  09/03/2022 Name: Candice Walker MRN: [id number] DOB: September 13, 1985  Referred by: Teodora Medici, DO Reason for referral : Appointment (I called to schedule the patient with the MM Team. I left a message on her friend's voicemail who is listed as the contact for the patient.)   An unsuccessful telephone outreach was attempted today. The patient was referred to the case management team for assistance with care management and care coordination.   Follow Up Plan: The care management team will reach out to the patient again over the next 14 days.   [place]

## 2022-10-07 IMAGING — US US ABDOMEN LIMITED
1 series · 14 of 25 positions shown · non-contrast
Comparison: None Available.

CLINICAL DATA: Epigastric and right upper quadrant pain.

EXAM:
ULTRASOUND ABDOMEN LIMITED RIGHT UPPER QUADRANT

[Series 1: us abdomen limited · 0.19mm/px · 14 of 57 slices shown]
[im 1/57]
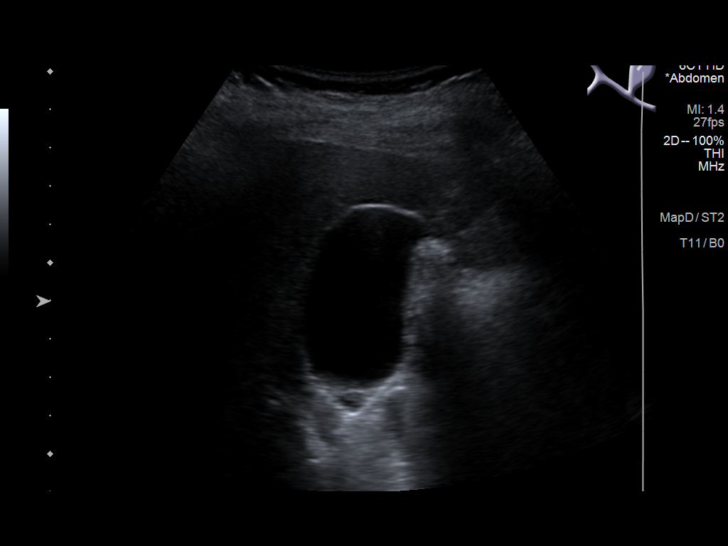
[im 5/57]
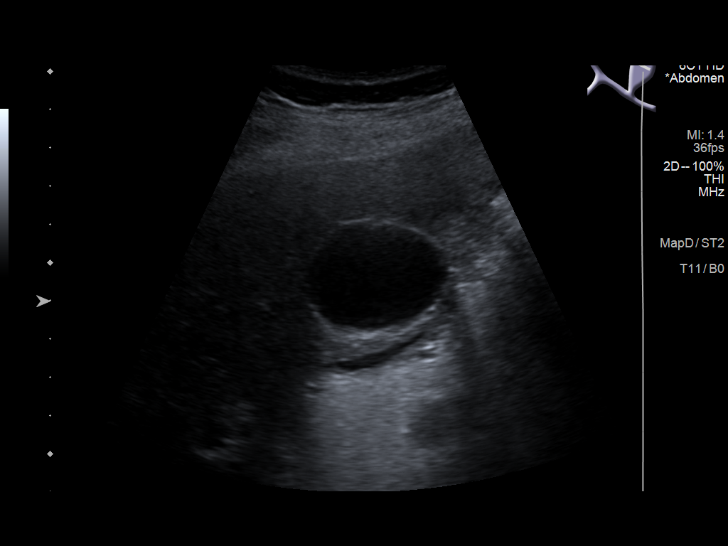
[im 10/57]
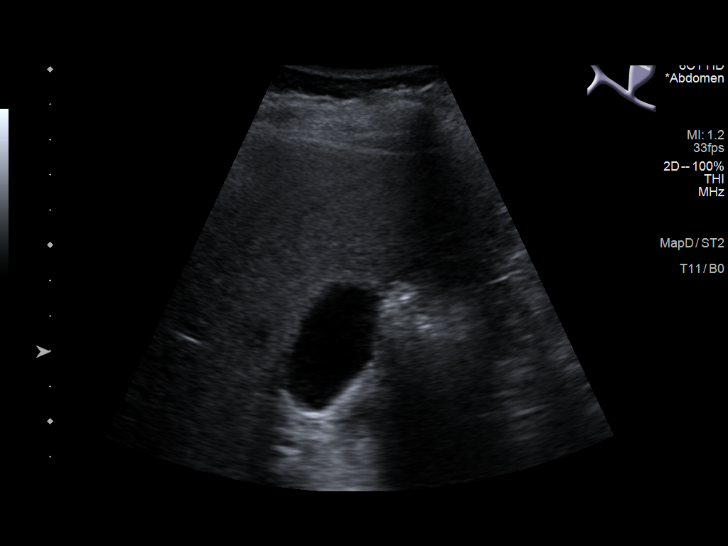
[im 15/57]
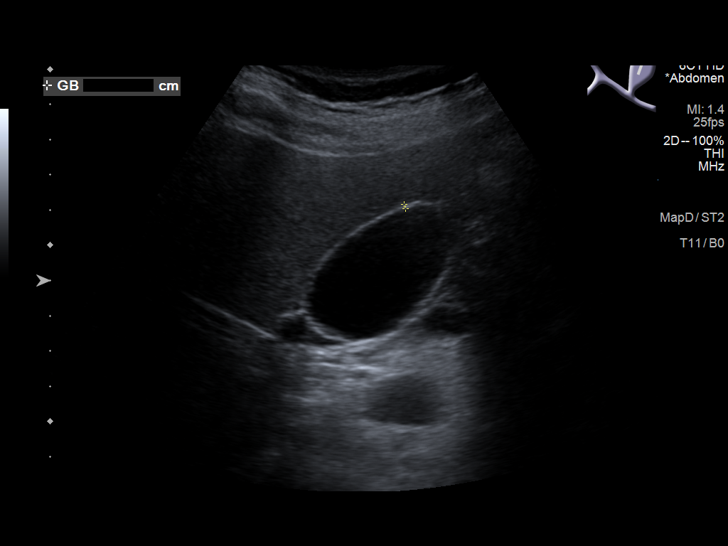
[im 19/57]
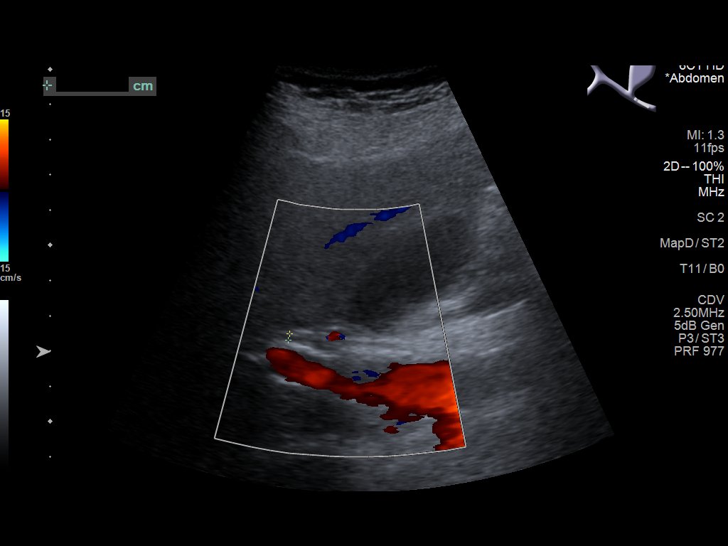
[im 22/57]
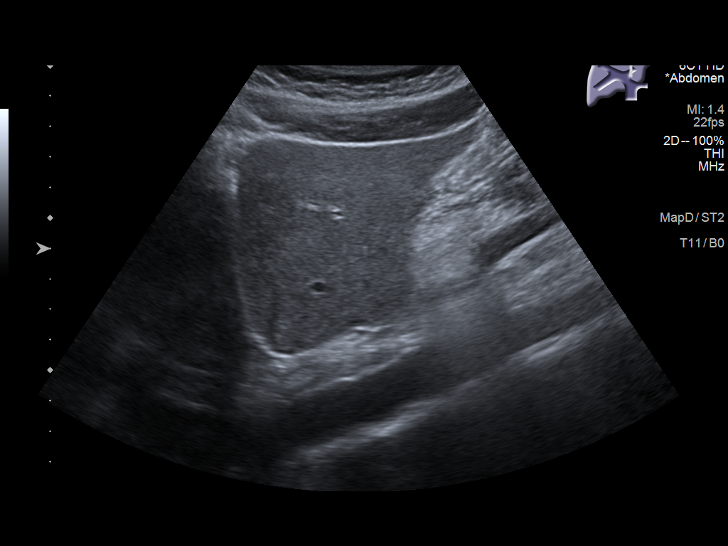
[im 26/57]
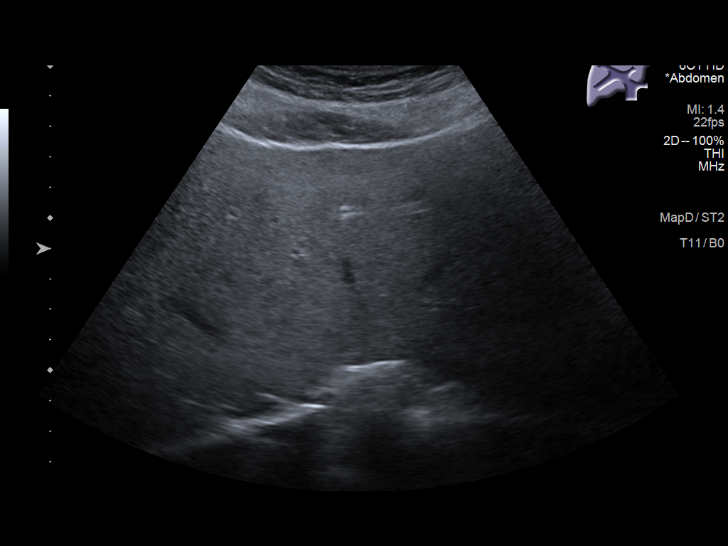
[im 31/57]
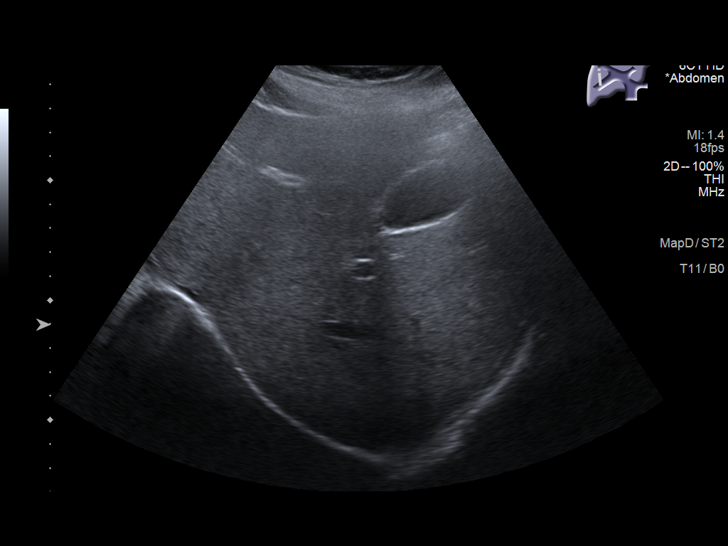
[im 36/57]
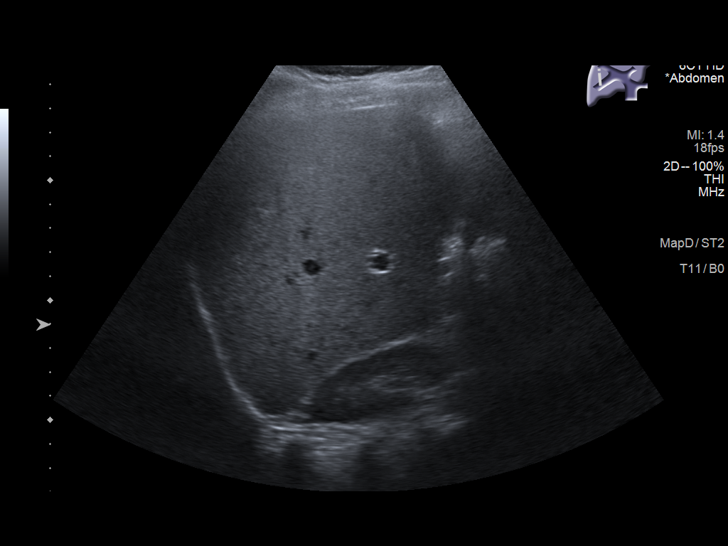
[im 38/57]
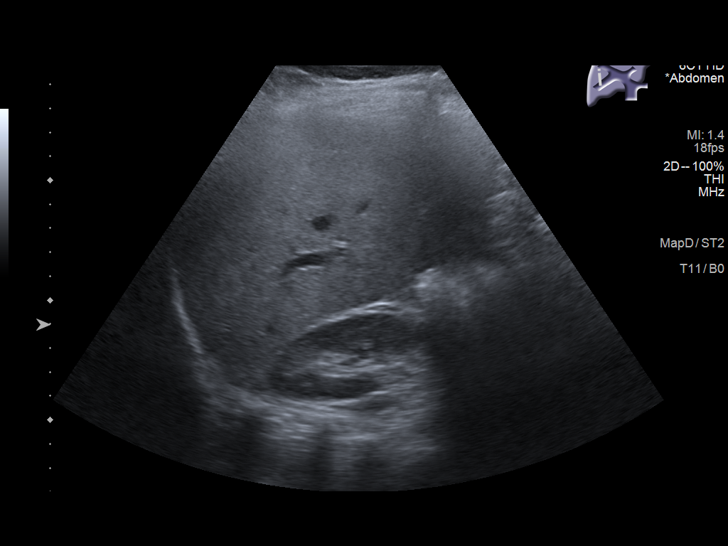
[im 43/57]
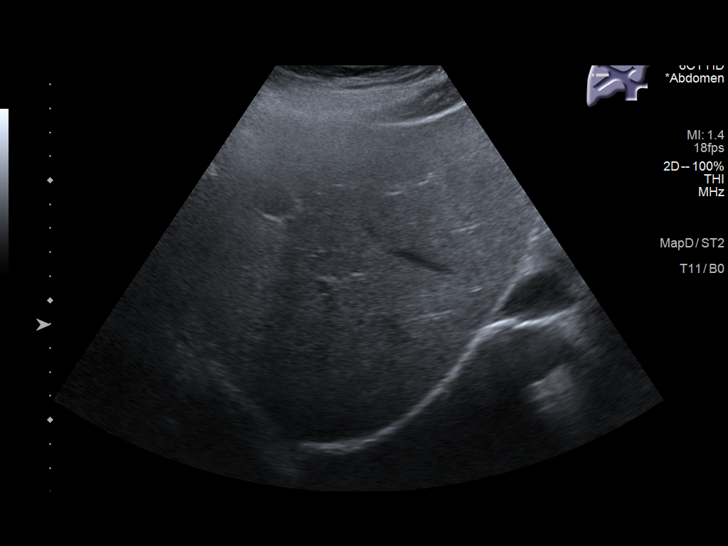
[im 47/57]
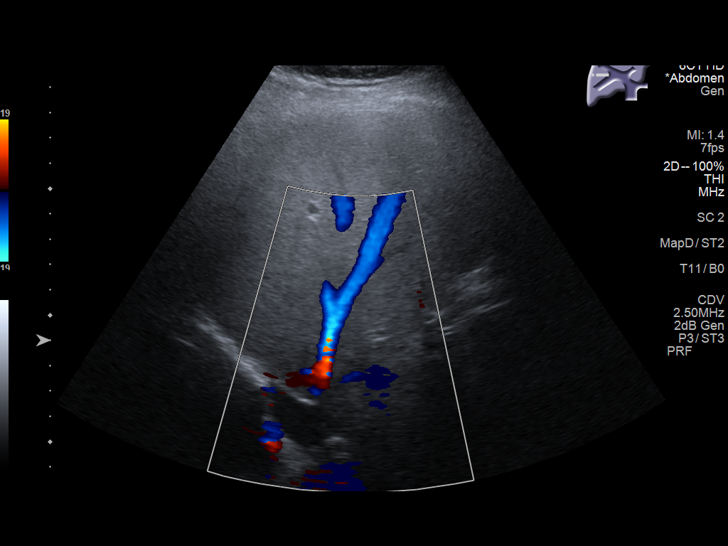
[im 52/57]
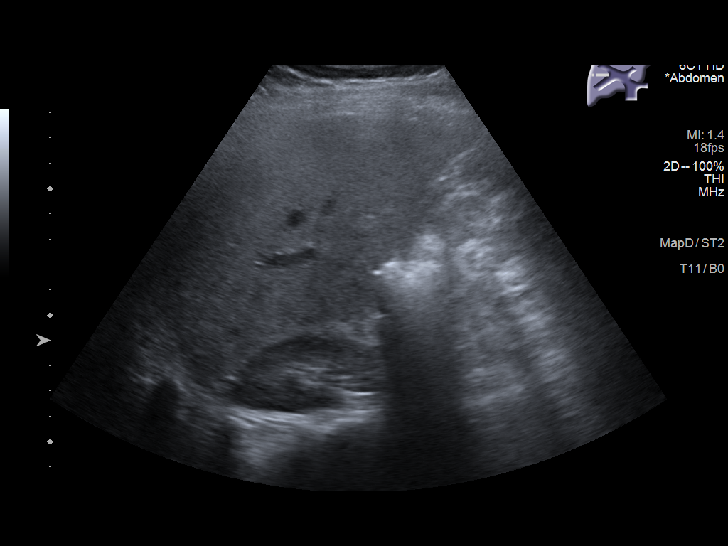
[im 57/57]
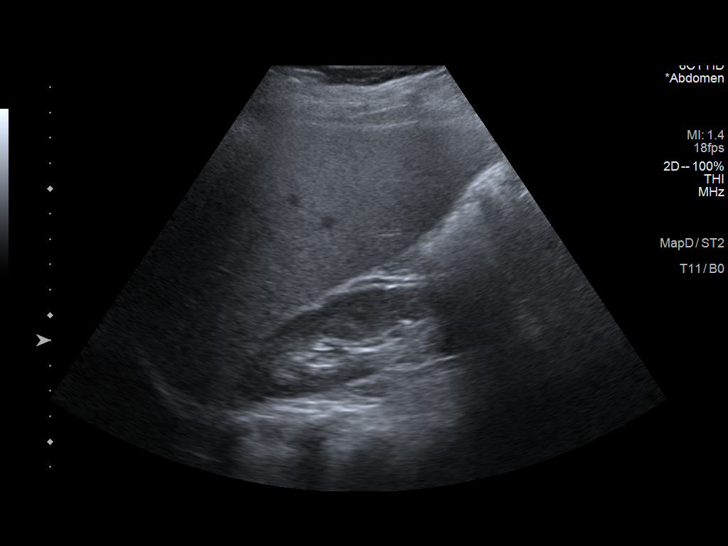

[14 of 25 positions shown; findings below may reference images not displayed]

FINDINGS: Gallbladder:

No gallstones or wall thickening visualized. No sonographic Murphy
sign noted by sonographer.

Common bile duct:

Diameter: 3 mm

Liver:

Increased echogenicity. No focal lesion. Portal vein is patent on
color Doppler imaging with normal direction of blood flow towards
the liver.

Other: None.
IMPRESSION: No cholelithiasis or sonographic evidence for acute cholecystitis.

Increased hepatic parenchymal echogenicity suggestive of steatosis.

## 2022-11-20 ENCOUNTER — Encounter: Payer: Self-pay | Admitting: Internal Medicine

## 2022-11-26 MED FILL — Iron Sucrose Inj 20 MG/ML (Fe Equiv): INTRAVENOUS | Qty: 10 | Status: AC

## 2022-11-27 ENCOUNTER — Encounter: Payer: Self-pay | Admitting: Internal Medicine

## 2022-11-27 ENCOUNTER — Inpatient Hospital Stay (HOSPITAL_BASED_OUTPATIENT_CLINIC_OR_DEPARTMENT_OTHER): Payer: Medicaid Other | Admitting: Internal Medicine

## 2022-11-27 ENCOUNTER — Inpatient Hospital Stay: Payer: Medicaid Other | Attending: Internal Medicine

## 2022-11-27 ENCOUNTER — Inpatient Hospital Stay: Payer: Medicaid Other

## 2022-11-27 DIAGNOSIS — D649 Anemia, unspecified: Secondary | ICD-10-CM

## 2022-11-27 DIAGNOSIS — D509 Iron deficiency anemia, unspecified: Secondary | ICD-10-CM | POA: Insufficient documentation

## 2022-11-27 DIAGNOSIS — Z79899 Other long term (current) drug therapy: Secondary | ICD-10-CM | POA: Diagnosis not present

## 2022-11-27 LAB — CBC WITH DIFFERENTIAL/PLATELET
Abs Immature Granulocytes: 0.04 10*3/uL (ref 0.00–0.07)
Basophils Absolute: 0.1 10*3/uL (ref 0.0–0.1)
Basophils Relative: 1 %
Eosinophils Absolute: 0.2 10*3/uL (ref 0.0–0.5)
Eosinophils Relative: 2 %
HCT: 34 % — ABNORMAL LOW (ref 36.0–46.0)
Hemoglobin: 10.5 g/dL — ABNORMAL LOW (ref 12.0–15.0)
Immature Granulocytes: 1 %
Lymphocytes Relative: 30 %
Lymphs Abs: 2.2 10*3/uL (ref 0.7–4.0)
MCH: 18.5 pg — ABNORMAL LOW (ref 26.0–34.0)
MCHC: 30.9 g/dL (ref 30.0–36.0)
MCV: 59.9 fL — ABNORMAL LOW (ref 80.0–100.0)
Monocytes Absolute: 0.6 10*3/uL (ref 0.1–1.0)
Monocytes Relative: 9 %
Neutro Abs: 4.3 10*3/uL (ref 1.7–7.7)
Neutrophils Relative %: 57 %
Platelets: 220 10*3/uL (ref 150–400)
RBC: 5.68 MIL/uL — ABNORMAL HIGH (ref 3.87–5.11)
RDW: 17.2 % — ABNORMAL HIGH (ref 11.5–15.5)
WBC: 7.3 10*3/uL (ref 4.0–10.5)
nRBC: 0 % (ref 0.0–0.2)

## 2022-11-27 LAB — BASIC METABOLIC PANEL
Anion gap: 6 (ref 5–15)
BUN: 15 mg/dL (ref 6–20)
CO2: 25 mmol/L (ref 22–32)
Calcium: 8.9 mg/dL (ref 8.9–10.3)
Chloride: 108 mmol/L (ref 98–111)
Creatinine, Ser: 1.07 mg/dL — ABNORMAL HIGH (ref 0.44–1.00)
GFR, Estimated: >60 mL/min (ref >60)
Glucose, Bld: 100 mg/dL — ABNORMAL HIGH (ref 70–99)
Potassium: 3.8 mmol/L (ref 3.5–5.1)
Sodium: 139 mmol/L (ref 135–145)

## 2022-11-27 LAB — FERRITIN: Ferritin: 115 ng/mL (ref 11–307)

## 2022-11-27 LAB — IRON AND TIBC
Iron: 77 ug/dL (ref 28–170)
Saturation Ratios: 21 % (ref 10.4–31.8)
TIBC: 368 ug/dL (ref 250–450)
UIBC: 291 ug/dL

## 2022-11-27 NOTE — Progress Notes (Signed)
Fatigue/weakness: weakness and fatigue with having to help with so many family Dyspena: no Light headedness: yes-feeling dizzy today Blood in stool: no  C/o tingle over whole body with elevated BP/under stress and will pass out. Concerned with why she's having these symptoms.

## 2022-11-27 NOTE — Assessment & Plan Note (Addendum)
#  Symptomatic anemia-multifactorial iron deficiency [thalassemia/menstrual]- ]iron deficiency s/p IV iron infusion.  SEP 2023- 2023- Iron sat- 26%; Ferritin- 106. Recommend holding IV iron infusion.   # HOLD off Iron infusion. NOT on Po iron sec to [GI distress]- currently NOT menstruating/ nursing [x 6 more months]  # Etiology likely: Pregnancy/thalassemia- post delivery in oct 2022.  However history of heavy menstrual cycles-might need more IV iron infusion in the future.  Hold off IV iron infusion today.  # Abdominal pain:? Iron pills vs other. JUNE 2023- [place] - fatty liver-  stable.   # DISPOSITION: # Interpreter # HOLD venofer today # in follow up 6 months-  with MD- labs-cbc/bmp; iron studies/ferritin-- possible venofer-Dr.B

## 2022-11-27 NOTE — Progress Notes (Signed)
[facility] CONSULT NOTE  Patient Care Team: Margarita Mail, DO as PCP - General (Internal Medicine) Earna Coder, MD as Consulting Physician (Oncology)  CHIEF COMPLAINTS/PURPOSE OF CONSULTATION: Iron deficient anemia  #Iron deficient anemia/thalassemia-pregnancy/delivery end of September 2022.   #History of syncope- s/p monitor cardiac evaluation  HISTORY OF PRESENTING ILLNESS: Ambulating independently.  Accompanied by interpreter/Host.   Candice Walker 37 y.o.  female patient from [place]-with chronic anemia/thalassemia-iron deficiency-- pregnancy [June 2022] here for follow-up.   Fatigue/weakness: weakness and fatigue with having to help with so many family Dyspena: no Light headedness: yes-feeling dizzy today Blood in stool: no  No menstrual cycles- as nursing baby [18 months]; Not on PO iron as causing stomach discontinued.     Review of Systems  Constitutional:  Positive for malaise/fatigue. Negative for chills, diaphoresis, fever and weight loss.  HENT:  Negative for nosebleeds and sore throat.   Eyes:  Negative for double vision.  Respiratory:  Negative for cough, hemoptysis, sputum production, shortness of breath and wheezing.   Cardiovascular:  Negative for chest pain, palpitations, orthopnea and leg swelling.  Gastrointestinal:  Negative for abdominal pain, blood in stool, constipation, diarrhea, heartburn, melena, nausea and vomiting.  Genitourinary:  Negative for dysuria, frequency and urgency.  Musculoskeletal:  Negative for back pain and joint pain.  Skin: Negative.  Negative for itching and rash.  Neurological:  Negative for dizziness, tingling, focal weakness, weakness and headaches.  Endo/Heme/Allergies:  Does not bruise/bleed easily.  Psychiatric/Behavioral:  Negative for depression. The patient is not nervous/anxious and does not have insomnia.      MEDICAL HISTORY:  Past Medical History:  Diagnosis Date   Anemia    Beta  thalassemia    GERD (gastroesophageal reflux disease)    Gestational diabetes    Hypertension     SURGICAL HISTORY: Past Surgical History:  Procedure Laterality Date   TUBAL LIGATION Bilateral 04/30/2021   Procedure: POST PARTUM TUBAL LIGATION;  Surgeon: Christeen Douglas, MD;  Location: [facility]S;  Service: Gynecology;  Laterality: Bilateral;    SOCIAL HISTORY: Social History   Socioeconomic History   Marital status: Married    Spouse name: Yama   Number of children: 6   Years of education: Not on file   Highest education level: Not on file  Occupational History   Not on file  Tobacco Use   Smoking status: Never    Passive exposure: Never   Smokeless tobacco: Never  Vaping Use   Vaping Use: Never used  Substance and Sexual Activity   Alcohol use: Never   Drug use: Never   Sexual activity: Yes    Birth control/protection: None    Comment: planning BTL  Other Topics Concern   Not on file  Social History Narrative   Patient previously lived in [place] and left as refugee. Resided in refugee camp in [place], then in [place] and now lives in [place] with a host family along with her husband and children.    Social Determinants of Health   Financial Resource Strain: Not on file  Food Insecurity: Not on file  Transportation Needs: Not on file  Physical Activity: Not on file  Stress: Not on file  Social Connections: Not on file  Intimate Partner Violence: Not on file    FAMILY HISTORY: Family History  Problem Relation Age of Onset   Hypertension Mother    Diabetes Father    Diabetes Paternal Grandmother     ALLERGIES:  has No Known Allergies.  MEDICATIONS:  Current Outpatient Medications  Medication Sig Dispense Refill   cetirizine (ZYRTEC) 10 MG chewable tablet Chew 10 mg by mouth daily.     gabapentin (NEURONTIN) 300 MG capsule Take 300 mg by mouth 3 (three) times daily.     labetalol (NORMODYNE) 100 MG tablet Take 1 tablet (100 mg total) by mouth 2 (two)  times daily. 60 tablet 0   omeprazole (PRILOSEC) 40 MG capsule Take 1 capsule (40 mg total) by mouth daily. 30 capsule 3   dicyclomine (BENTYL) 20 MG tablet Take 20 mg by mouth 2 (two) times daily. (Patient not taking: Reported on 11/27/2022)     escitalopram (LEXAPRO) 10 MG tablet Take 10 mg by mouth daily. (Patient not taking: Reported on 11/27/2022)     FEROSUL 325 (65 Fe) MG tablet Take by mouth. (Patient not taking: Reported on 11/27/2022)     Multiple Vitamins-Iron (MULTIVITAMINS WITH IRON) TABS tablet Take 1 tablet by mouth daily. (Patient not taking: Reported on 05/28/2022)     omeprazole (PRILOSEC) 20 MG capsule Take 20 mg by mouth daily. (Patient not taking: Reported on 05/28/2022)     No current facility-administered medications for this visit.      Marland Kitchen  PHYSICAL EXAMINATION:  Vitals:   11/27/22 1319  BP: 126/82  Pulse: 81  Temp: 99.3 F (37.4 C)  SpO2: 99%    Filed Weights   11/27/22 1319  Weight: 142 lb 3.2 oz (64.5 kg)     Physical Exam Vitals and nursing note reviewed.  HENT:     Head: Normocephalic and atraumatic.     Mouth/Throat:     Pharynx: Oropharynx is clear.  Eyes:     Extraocular Movements: Extraocular movements intact.     Pupils: Pupils are equal, round, and reactive to light.  Cardiovascular:     Rate and Rhythm: Normal rate and regular rhythm.  Pulmonary:     Comments: Decreased breath sounds bilaterally.  Abdominal:     Palpations: Abdomen is soft.  Musculoskeletal:        General: Normal range of motion.     Cervical back: Normal range of motion.  Skin:    General: Skin is warm.  Neurological:     General: No focal deficit present.     Mental Status: She is alert and oriented to person, place, and time.  Psychiatric:        Behavior: Behavior normal.        Judgment: Judgment normal.     LABORATORY DATA:  I have reviewed the data as listed Lab Results  Component Value Date   WBC 7.3 11/27/2022   HGB 10.5 (L) 11/27/2022   HCT  34.0 (L) 11/27/2022   MCV 59.9 (L) 11/27/2022   PLT 220 11/27/2022   Recent Labs    01/18/22 1316 02/24/22 2203 04/12/22 1422 05/28/22 1314 11/27/22 1319  NA 137 139 138 139 139  K 3.7 3.6 3.5 3.6 3.8  CL 104 106 106 108 108  CO2 GLUCOSE 132* 121* 138* 139* 100*  BUN CREATININE 0.62 0.57 0.66 0.46 1.07*  CALCIUM 9.1 9.5 8.8* 8.9 8.9  GFRNONAA >60 >60 >60 >60 >60  PROT 7.7 7.7 7.4  --   --   ALBUMIN 4.5 4.6 4.5  --   --   AST --   --   ALT --   --   Jolly Mango  97 103 93  --   --   BILITOT 0.8 0.7 0.4  --   --     RADIOGRAPHIC STUDIES: I have personally reviewed the radiological images as listed and agreed with the findings in the report. No results found.  Symptomatic anemia #Symptomatic anemia-multifactorial iron deficiency [thalassemia/menstrual]- ]iron deficiency s/p IV iron infusion.  SEP 2023- 2023- Iron sat- 26%; Ferritin- 106. Recommend holding IV iron infusion.   # HOLD off Iron infusion. NOT on Po iron sec to [GI distress]- currently NOT menstruating/ nursing [x 6 more months]  # Etiology likely: Pregnancy/thalassemia- post delivery in oct 2022.  However history of heavy menstrual cycles-might need more IV iron infusion in the future.  Hold off IV iron infusion today.  # Abdominal pain:? Iron pills vs other. JUNE 2023- [place] - fatty liver-  stable.   # DISPOSITION: # Interpreter # HOLD venofer today # in follow up 6 months-  with MD- labs-cbc/bmp; iron studies/ferritin-- possible venofer-Dr.B  Symptomatic anemia #Symptomatic anemia-iron deficiency /thalassemia s/p IV iron infusion: Hgb 10.6, microcytic. Iron stores pending. I advised to wait for Iron/TIBC to return before proceeding forward with IV iron. Patient and host strongly requested IV iron as she felt better after this previously and have significant transportation concerns for returning within the near future. Discussed risks. Proceed forward with one dose  only until labs result.   We also had a long discussion regarding her mental health and how her fatigue may also be secondary to this. I have recommended that she have close follow up with her PCP which she is agreeable with.    # DISPOSITION: Venofer today- PT request.  # in 4 months- follow up with MD- labs-cbc/bmp; iron studies/ferritin-- possible venofer  All questions were answered. The patient knows to call the clinic with any problems, questions or concerns.       Earna Coder, MD 11/27/2022 2:00 PM

## 2022-12-17 ENCOUNTER — Encounter: Payer: Self-pay | Admitting: Internal Medicine

## 2022-12-20 ENCOUNTER — Ambulatory Visit: Payer: Medicaid Other

## 2022-12-20 DIAGNOSIS — Z23 Encounter for immunization: Secondary | ICD-10-CM

## 2022-12-20 DIAGNOSIS — Z719 Counseling, unspecified: Secondary | ICD-10-CM

## 2022-12-20 NOTE — Progress Notes (Signed)
Pt ([place]) seen in clinic for vaccine needed for Immigration accompanied by her husband, children and volunteer Ms. Dorrene German who helped with Pashto language interpretation. Pt's vaccine record was updated in [facility] and reviewed. Given VIS statements, agreed for Tdap, Varicella and Heplisav-B, declined flu vaccine today. Administered, tolerated well. Given [facility] copies, explained and understood. M.Ameila Weldon, LPN.

## 2023-01-17 ENCOUNTER — Ambulatory Visit: Payer: Medicaid Other

## 2023-01-17 ENCOUNTER — Ambulatory Visit (LOCAL_COMMUNITY_HEALTH_CENTER): Payer: Medicaid Other

## 2023-01-17 DIAGNOSIS — Z719 Counseling, unspecified: Secondary | ICD-10-CM

## 2023-01-17 DIAGNOSIS — Z23 Encounter for immunization: Secondary | ICD-10-CM | POA: Diagnosis not present

## 2023-01-17 NOTE — Progress Notes (Signed)
VIS provided to family in Pashto.  Interpretation provided by language line ID [id number] Michela Pitcher) and [phone] Thomasene Ripple.   Vaccine administered and tolerated well, client with no questions.  Anastazja Isaac Sherrilyn Rist, RN

## 2023-05-27 ENCOUNTER — Encounter: Payer: Self-pay | Admitting: Internal Medicine

## 2023-06-03 ENCOUNTER — Inpatient Hospital Stay: Payer: MEDICAID

## 2023-06-03 ENCOUNTER — Encounter: Payer: Self-pay | Admitting: Nurse Practitioner

## 2023-06-03 ENCOUNTER — Inpatient Hospital Stay: Payer: MEDICAID | Attending: Internal Medicine | Admitting: Nurse Practitioner

## 2023-06-03 VITALS — BP 130/72 | HR 69

## 2023-06-03 VITALS — BP 124/84 | HR 87 | Temp 97.5°F | Resp 20 | Wt 143.6 lb

## 2023-06-03 DIAGNOSIS — D500 Iron deficiency anemia secondary to blood loss (chronic): Secondary | ICD-10-CM | POA: Diagnosis not present

## 2023-06-03 DIAGNOSIS — D649 Anemia, unspecified: Secondary | ICD-10-CM

## 2023-06-03 DIAGNOSIS — D509 Iron deficiency anemia, unspecified: Secondary | ICD-10-CM | POA: Insufficient documentation

## 2023-06-03 LAB — CBC WITH DIFFERENTIAL (CANCER CENTER ONLY)
Abs Immature Granulocytes: 0.04 10*3/uL (ref 0.00–0.07)
Basophils Absolute: 0 10*3/uL (ref 0.0–0.1)
Basophils Relative: 1 %
Eosinophils Absolute: 0.1 10*3/uL (ref 0.0–0.5)
Eosinophils Relative: 1 %
HCT: 34.2 % — ABNORMAL LOW (ref 36.0–46.0)
Hemoglobin: 10.4 g/dL — ABNORMAL LOW (ref 12.0–15.0)
Immature Granulocytes: 1 %
Lymphocytes Relative: 31 %
Lymphs Abs: 2.2 10*3/uL (ref 0.7–4.0)
MCH: 18.5 pg — ABNORMAL LOW (ref 26.0–34.0)
MCHC: 30.4 g/dL (ref 30.0–36.0)
MCV: 61 fL — ABNORMAL LOW (ref 80.0–100.0)
Monocytes Absolute: 0.4 10*3/uL (ref 0.1–1.0)
Monocytes Relative: 6 %
Neutro Abs: 4.3 10*3/uL (ref 1.7–7.7)
Neutrophils Relative %: 60 %
Platelet Count: 229 10*3/uL (ref 150–400)
RBC: 5.61 MIL/uL — ABNORMAL HIGH (ref 3.87–5.11)
RDW: 16.1 % — ABNORMAL HIGH (ref 11.5–15.5)
WBC Count: 7 10*3/uL (ref 4.0–10.5)
nRBC: 0 % (ref 0.0–0.2)

## 2023-06-03 LAB — BASIC METABOLIC PANEL - CANCER CENTER ONLY
Anion gap: 6 (ref 5–15)
BUN: 10 mg/dL (ref 6–20)
CO2: 25 mmol/L (ref 22–32)
Calcium: 8.8 mg/dL — ABNORMAL LOW (ref 8.9–10.3)
Chloride: 105 mmol/L (ref 98–111)
Creatinine: 0.55 mg/dL (ref 0.44–1.00)
GFR, Estimated: >60 mL/min (ref >60)
Glucose, Bld: 165 mg/dL — ABNORMAL HIGH (ref 70–99)
Potassium: 3.5 mmol/L (ref 3.5–5.1)
Sodium: 136 mmol/L (ref 135–145)

## 2023-06-03 LAB — IRON AND TIBC
Iron: 79 ug/dL (ref 28–170)
Saturation Ratios: 21 % (ref 10.4–31.8)
TIBC: 371 ug/dL (ref 250–450)
UIBC: 292 ug/dL

## 2023-06-03 LAB — FERRITIN: Ferritin: 52 ng/mL (ref 11–307)

## 2023-06-03 MED ORDER — IRON SUCROSE 20 MG/ML IV SOLN
200.0000 mg | Freq: Once | INTRAVENOUS | Status: AC
  Administered 2023-06-03: 200 mg via INTRAVENOUS

## 2023-06-03 MED ORDER — IRON SUCROSE 20 MG/ML IV SOLN
200.0000 mg | Freq: Once | INTRAVENOUS | Status: DC
  Filled 2023-06-03: qty 10

## 2023-06-03 MED ORDER — SODIUM CHLORIDE 0.9% FLUSH
10.0000 mL | Freq: Once | INTRAVENOUS | Status: AC | PRN
  Administered 2023-06-03: 10 mL
  Filled 2023-06-03: qty 10

## 2023-06-03 NOTE — Progress Notes (Unsigned)
Patient states she is bleeding a lot. Patient states This is her 11th day on her menstrual cycle. Patient states lower abdominal is hurting a lot.

## 2023-06-03 NOTE — Progress Notes (Unsigned)
[facility] CONSULT NOTE  Patient Care Team: Margarita Mail, Candice as PCP - General (Internal Medicine) Earna Walker, Candice Walker: Ambulating independently.  Accompanied by interpreter/Host.   Thy Klink 37 y.o.  female patient from [place], with chronic anemia/thalassemia, iron deficiency, pregnancy [June 2022], heavy menses, who returns to clinic for follow up.   Endorses pelvic pain with menses.     Review of Systems  Constitutional:  Positive for malaise/fatigue. Negative for chills, diaphoresis, fever and weight loss.  HENT:  Negative for nosebleeds and sore throat.   Eyes:  Negative for double vision.  Respiratory:  Negative for cough, hemoptysis, sputum production, shortness of breath and wheezing.   Cardiovascular:  Negative for chest pain, palpitations, orthopnea and leg swelling.  Gastrointestinal:  Negative for abdominal pain, blood in stool, constipation, diarrhea, heartburn, melena, nausea and vomiting.  Genitourinary:  Negative for dysuria, frequency and urgency.  Musculoskeletal:  Negative for back pain and joint pain.  Skin: Negative.  Negative for itching and rash.  Neurological:  Negative for dizziness, tingling, focal weakness, weakness and headaches.  Endo/Heme/Allergies:  Does not bruise/bleed easily.  Psychiatric/Behavioral:  Negative for depression. The patient is not nervous/anxious and does not have insomnia.      MEDICAL HISTORY:  Past Medical History:  Diagnosis Date   Anemia    Beta thalassemia (HCC)    GERD (gastroesophageal reflux disease)    Gestational diabetes    Hypertension     SURGICAL HISTORY: Past Surgical History:  Procedure Laterality Date   TUBAL  LIGATION Bilateral 04/30/2021   Procedure: POST PARTUM TUBAL LIGATION;  Surgeon: Christeen Douglas, Candice;  Location: [facility] ORS;  Service: Gynecology;  Laterality: Bilateral;    SOCIAL HISTORY: Social History   Socioeconomic History   Marital status: Married    Spouse name: Yama   Number of children: 6   Years of education: Not on file   Highest education level: Not on file  Occupational History   Not on file  Tobacco Use   Smoking status: Never    Passive exposure: Never   Smokeless tobacco: Never  Vaping Use   Vaping status: Never Used  Substance and Sexual Activity   Alcohol use: Never   Drug use: Never   Sexual activity: Yes    Birth control/protection: None    Comment: planning BTL  Other Topics Concern   Not on file  Social History Narrative   Patient previously lived in [place] and left as refugee. Resided in refugee camp in [place], then in [place] and now lives in [place] with a host family along with her husband and children.    Social Determinants of Health   Financial Resource Strain: Not on file  Food Insecurity: Not on file  Transportation Needs: Not on file  Physical Activity: Not on file  Stress: Not on file  Social Connections: Not on file  Intimate Partner Violence: Not on file    FAMILY HISTORY: Family History  Problem Relation Age of Onset   Hypertension Mother    Diabetes Father    Diabetes Paternal Grandmother     ALLERGIES:  has No Known Allergies.  MEDICATIONS:  Current Outpatient Medications  Medication Sig Dispense Refill   cetirizine (ZYRTEC) 10 MG chewable tablet Chew 10 mg by mouth daily.  escitalopram (LEXAPRO) 10 MG tablet Take 10 mg by mouth daily.     lisinopril (ZESTRIL) 10 MG tablet Take 10 mg by mouth daily.     metoprolol succinate (TOPROL-XL) 25 MG 24 hr tablet Take 25 mg by mouth daily.     omeprazole (PRILOSEC) 40 MG capsule Take 1 capsule (40 mg total) by mouth daily. 30 capsule 3   pantoprazole (PROTONIX) 40 MG  tablet Take 40 mg by mouth daily.     dicyclomine (BENTYL) 20 MG tablet Take 20 mg by mouth 2 (two) times daily. (Patient not taking: Reported on 11/27/2022)     FEROSUL 325 (65 Fe) MG tablet Take by mouth. (Patient not taking: Reported on 11/27/2022)     gabapentin (NEURONTIN) 300 MG capsule Take 300 mg by mouth 3 (three) times daily. (Patient not taking: Reported on 06/03/2023)     labetalol (NORMODYNE) 100 MG tablet Take 1 tablet (100 mg total) by mouth 2 (two) times daily. (Patient not taking: Reported on 06/03/2023) 60 tablet 0   Multiple Vitamins-Iron (MULTIVITAMINS WITH IRON) TABS tablet Take 1 tablet by mouth daily. (Patient not taking: Reported on 05/28/2022)     omeprazole (PRILOSEC) 20 MG capsule Take 20 mg by mouth daily. (Patient not taking: Reported on 05/28/2022)     No current facility-administered medications for this visit.       PHYSICAL EXAMINATION: Vitals:   06/03/23 1327  BP: 124/84  Pulse: 87  Resp: 20  Temp: (!) 97.5 F (36.4 C)  SpO2: 100%   Filed Weights   06/03/23 1327  Weight: 143 lb 9.6 oz (65.1 kg)   Physical Exam Vitals and nursing note reviewed.  HENT:     Head: Normocephalic and atraumatic.     Mouth/Throat:     Pharynx: Oropharynx is clear.  Eyes:     Extraocular Movements: Extraocular movements intact.     Pupils: Pupils are equal, round, and reactive to light.  Cardiovascular:     Rate and Rhythm: Normal rate and regular rhythm.  Pulmonary:     Comments: Decreased breath sounds bilaterally.  Abdominal:     Palpations: Abdomen is soft.  Musculoskeletal:        General: Normal range of motion.     Cervical back: Normal range of motion.  Skin:    General: Skin is warm.  Neurological:     General: No focal deficit present.     Mental Status: She is alert and oriented to person, place, and time.  Psychiatric:        Behavior: Behavior normal.        Judgment: Judgment normal.   LABORATORY DATA:  I have reviewed the data as  listed Lab Results  Component Value Date   WBC 7.0 06/03/2023   HGB 10.4 (L) 06/03/2023   HCT 34.2 (L) 06/03/2023   MCV 61.0 (L) 06/03/2023   PLT 229 06/03/2023   Recent Labs    11/27/22 1319 06/03/23 1245  NA 139 136  K 3.8 3.5  CL 108 105  CO2 25 25  GLUCOSE 100* 165*  BUN 15 10  CREATININE 1.07* 0.55  CALCIUM 8.9 8.8*  GFRNONAA >60 >60   Iron/TIBC/Ferritin/ %Sat    Component Value Date/Time   IRON 77 11/27/2022 1319   TIBC 368 11/27/2022 1319   FERRITIN 115 11/27/2022 1319   IRONPCTSAT 21 11/27/2022 1319     RADIOGRAPHIC STUDIES: I have personally reviewed the radiological images as listed and agreed with the findings in the  report. No results found.  No problem-specific Assessment & Plan notes found for this encounter.  # Symptomatic anemia-iron deficiency /thalassemia s/p IV iron infusion: Hgb 10.6, microcytic. Iron stores pending. I advised to wait for Iron/TIBC to return before proceeding forward with IV iron. Patient and host strongly requested IV iron as she felt better after this previously and have significant transportation concerns for returning within the near future. Discussed risks. Proceed forward with one dose only until labs result.   We also had a long discussion regarding her mental health and how her fatigue may also be secondary to this. I have recommended that she have close follow up with her PCP which she is agreeable with.    # DISPOSITION: 6 mo- lab (cbc, cmp, ferritin, iron studies), Dr Donneta Romberg, +/- venofer- la  All questions were answered. The patient knows to call the clinic with any problems, questions or concerns.   Alinda Dooms, NP 06/03/2023 2:03 PM

## 2023-06-03 NOTE — Patient Instructions (Signed)
Iron Sucrose Injection What is this medication? IRON SUCROSE (EYE ern SOO krose) treats low levels of iron (iron deficiency anemia) in people with kidney disease. Iron is a mineral that plays an important role in making red blood cells, which carry oxygen from your lungs to the rest of your body. This medicine may be used for other purposes; ask your health care provider or pharmacist if you have questions. COMMON BRAND NAME(S): Venofer What should I tell my care team before I take this medication? They need to know if you have any of these conditions: Anemia not caused by low iron levels Heart disease High levels of iron in the blood Kidney disease Liver disease An unusual or allergic reaction to iron, other medications, foods, dyes, or preservatives Pregnant or trying to get pregnant Breastfeeding How should I use this medication? This medication is for infusion into a vein. It is given in a hospital or clinic setting. Talk to your care team about the use of this medication in children. While this medication may be prescribed for children as young as 2 years for selected conditions, precautions do apply. Overdosage: If you think you have taken too much of this medicine contact a poison control center or emergency room at once. NOTE: This medicine is only for you. Do not share this medicine with others. What if I miss a dose? Keep appointments for follow-up doses. It is important not to miss your dose. Call your care team if you are unable to keep an appointment. What may interact with this medication? Do not take this medication with any of the following: Deferoxamine Dimercaprol Other iron products This medication may also interact with the following: Chloramphenicol Deferasirox This list may not describe all possible interactions. Give your health care provider a list of all the medicines, herbs, non-prescription drugs, or dietary supplements you use. Also tell them if you smoke,  drink alcohol, or use illegal drugs. Some items may interact with your medicine. What should I watch for while using this medication? Visit your care team regularly. Tell your care team if your symptoms do not start to get better or if they get worse. You may need blood work done while you are taking this medication. You may need to follow a special diet. Talk to your care team. Foods that contain iron include: whole grains/cereals, dried fruits, beans, or peas, leafy green vegetables, and organ meats (liver, kidney). What side effects may I notice from receiving this medication? Side effects that you should report to your care team as soon as possible: Allergic reactions--skin rash, itching, hives, swelling of the face, lips, tongue, or throat Low blood pressure--dizziness, feeling faint or lightheaded, blurry vision Shortness of breath Side effects that usually do not require medical attention (report to your care team if they continue or are bothersome): Flushing Headache Joint pain Muscle pain Nausea Pain, redness, or irritation at injection site This list may not describe all possible side effects. Call your doctor for medical advice about side effects. You may report side effects to FDA at 1-800-FDA-1088. Where should I keep my medication? This medication is given in a hospital or clinic. It will not be stored at home. NOTE: This sheet is a summary. It may not cover all possible information. If you have questions about this medicine, talk to your doctor, pharmacist, or health care provider.  2024 Elsevier/Gold Standard (2022-12-27 00:00:00)

## 2023-06-03 NOTE — Progress Notes (Signed)
Patient tolerated Venofer infusion well. Explained recommendation of 30 min post monitoring. Patient refused to wait post monitoring. Educated on what signs to watch for & to call with any concerns. No question, discharged. Stable  

## 2023-06-05 ENCOUNTER — Encounter: Payer: Self-pay | Admitting: Internal Medicine

## 2023-06-11 ENCOUNTER — Other Ambulatory Visit: Payer: Self-pay | Admitting: Internal Medicine

## 2023-06-11 DIAGNOSIS — K219 Gastro-esophageal reflux disease without esophagitis: Secondary | ICD-10-CM

## 2023-06-12 NOTE — Telephone Encounter (Signed)
Requested medications are due for refill today.  unsure  Requested medications are on the active medications list.  yes  Last refill. 02/13/2022 #30 3 rf  Future visit scheduled.   no  Notes to clinic.  Pt not seen for 1 year. Last refilled over 1 year ago.    Requested Prescriptions  Pending Prescriptions Disp Refills   omeprazole (PRILOSEC) 40 MG capsule [Pharmacy Med Name: OMEPRAZOLE DR 40 MG CAPSULE] 90 capsule 1    Sig: Take 1 capsule (40 mg total) by mouth daily.     Gastroenterology: Proton Pump Inhibitors Failed - 06/11/2023 11:00 AM      Failed - Valid encounter within last 12 months    Recent Outpatient Visits           1 year ago Hypertension, unspecified type   [facility] Margarita Mail, DO   1 year ago Epigastric pain   [facility] Margarita Mail, DO   1 year ago Hypertension, unspecified type   [facility] Margarita Mail, DO   1 year ago Hypertension, unspecified type   [facility] Berniece Salines, FNP

## 2023-06-26 ENCOUNTER — Encounter: Payer: Self-pay | Admitting: Internal Medicine

## 2023-06-26 ENCOUNTER — Ambulatory Visit: Payer: Self-pay

## 2023-06-26 NOTE — Telephone Encounter (Signed)
Chief Complaint: Back Pain Symptoms: back pain, neck pain, fainting Frequency: comes and goes Pertinent Negatives: Patient denies fever, nausea, vomiting  Disposition: [] ED /[] Urgent Care (no appt availability in office) / [x] Appointment(In office/virtual)/ []  [place] [facility]/ [] Home Care/ [] Refused Recommended Disposition /[] [place] [facility]/ []  Follow-up with PCP Additional Notes: Patient's case manager called stating the patient fainted about 1 month ago and was complaining of neck and back pain during home visit this week. Case manager stated that the patient thought she did not have a PCP so she was trying to set up an appointment to establish care. Advised patient does have a PCP listed. Care advice was given and patient has been scheduled to see PCP tomorrow.  Reason for Disposition  [1] MODERATE back pain (e.g., interferes with normal activities) AND [2] present > 3 days  Answer Assessment - Initial Assessment Questions 1. ONSET: "When did the pain begin?"      1 month ago  2. LOCATION: "Where does it hurt?" (upper, mid or lower back)     Lower back 3. SEVERITY: "How bad is the pain?"  (e.g., Scale 1-10; mild, moderate, or severe)   - MILD (1-3): Doesn't interfere with normal activities.    - MODERATE (4-7): Interferes with normal activities or awakens from sleep.    - SEVERE (8-10): Excruciating pain, unable to do any normal activities.      Unsure  4. PATTERN: "Is the pain constant?" (e.g., yes, no; constant, intermittent)      Comes and goes  5. RADIATION: "Does the pain shoot into your legs or somewhere else?"     Neck  6. CAUSE:  "What do you think is causing the back pain?"      Patient fell about 1 month ago  7. BACK OVERUSE:  "Any recent lifting of heavy objects, strenuous work or exercise?"     No 8. MEDICINES: "What have you taken so far for the pain?" (e.g., nothing, acetaminophen, NSAIDS)     Not sure  9. NEUROLOGIC SYMPTOMS: "Do you have any weakness,  numbness, or problems with bowel/bladder control?"     Unsure  10. OTHER SYMPTOMS: "Do you have any other symptoms?" (e.g., fever, abdomen pain, burning with urination, blood in urine)       Neck pain  Protocols used: Back Pain-A-AH

## 2023-06-27 ENCOUNTER — Encounter: Payer: Self-pay | Admitting: Internal Medicine

## 2023-06-27 ENCOUNTER — Ambulatory Visit
Admission: RE | Admit: 2023-06-27 | Discharge: 2023-06-27 | Disposition: A | Discharge status: Home / Self Care | Payer: MEDICAID | Source: Ambulatory Visit | Attending: Internal Medicine | Admitting: Internal Medicine

## 2023-06-27 ENCOUNTER — Ambulatory Visit (INDEPENDENT_AMBULATORY_CARE_PROVIDER_SITE_OTHER): Payer: MEDICAID | Admitting: Internal Medicine

## 2023-06-27 ENCOUNTER — Ambulatory Visit
Admission: RE | Admit: 2023-06-27 | Discharge: 2023-06-27 | Disposition: A | Discharge status: Home / Self Care | Payer: MEDICAID | Attending: Internal Medicine | Admitting: Internal Medicine

## 2023-06-27 ENCOUNTER — Ambulatory Visit: Payer: MEDICAID | Admitting: Internal Medicine

## 2023-06-27 VITALS — BP 118/74 | HR 104 | Temp 97.4°F | Resp 18 | Ht 60.5 in | Wt 141.5 lb

## 2023-06-27 DIAGNOSIS — M542 Cervicalgia: Secondary | ICD-10-CM

## 2023-06-27 DIAGNOSIS — M546 Pain in thoracic spine: Secondary | ICD-10-CM

## 2023-06-27 DIAGNOSIS — G8929 Other chronic pain: Secondary | ICD-10-CM

## 2023-06-27 NOTE — Progress Notes (Signed)
   Acute Office Visit  Subjective:     Patient ID: Candice Walker, female    DOB: [birth year], 37 y.o.   MRN: [id number]  Chief Complaint  Patient presents with   Neck Pain    HPI Patient is in today for chronic neck and shoulder blade pain. Interpretor services used to help with communication. Pain is at the base of the neck will radiate into shoulder blades. Going on for years after she had a bad fall but the pain is getting worse over the last 6 months. Relieved with ibuprofen. She describes the pain as a burning type pain. No skin changes. Endorses tingling sensation radiating into arms and hands but also legs. No weakness. Bending down, lifting makes the pain worse. Hands will feel heavy and numb.  Review of Systems  Musculoskeletal:  Positive for back pain and neck pain.  Neurological:  Positive for tingling. Negative for weakness.        Objective:    Pulse (!) 104   Temp (!) 97.4 F (36.3 C)   Resp 18   Ht 5' 0.5" (1.537 m)   Wt 141 lb 8 oz (64.2 kg)   LMP 06/02/2023 (Approximate)   SpO2 98%   BMI 27.18 kg/m  BP Readings from Last 3 Encounters:  06/27/23 118/74  06/03/23 130/72  06/03/23 124/84   Wt Readings from Last 3 Encounters:  06/27/23 141 lb 8 oz (64.2 kg)  06/03/23 143 lb 9.6 oz (65.1 kg)  11/27/22 142 lb 3.2 oz (64.5 kg)      Physical Exam Constitutional:      Appearance: Normal appearance.  HENT:     Head: Normocephalic and atraumatic.  Eyes:     Conjunctiva/sclera: Conjunctivae normal.  Cardiovascular:     Rate and Rhythm: Normal rate and regular rhythm.  Pulmonary:     Effort: Pulmonary effort is normal.     Breath sounds: Normal breath sounds.  Musculoskeletal:     Cervical back: Normal range of motion. Pain with movement and muscular tenderness present.     Thoracic back: Tenderness present. Normal range of motion.  Skin:    General: Skin is warm and dry.  Neurological:     General: No focal deficit present.     Mental Status: She  is alert. Mental status is at baseline.     Sensory: No sensory deficit.     Motor: No weakness.     Gait: Gait normal.  Psychiatric:        Mood and Affect: Mood normal.        Behavior: Behavior normal.     No results found for any visits on 06/27/23.      Assessment & Plan:   1. Neck pain, chronic/Chronic midline thoracic back pain: Exam consistent with muscular pain, however she is having tingling into her arms and hands. Will start with x-rays of the cervical and thoracic spine, may require MRI of the cervical spine based on results. Offered to prescribe stronger anti-inflammatory but patient wants to continue Ibuprofen as needed.  - DG Cervical Spine Complete; Future - DG Thoracic Spine 2 View; Future   Return if symptoms worsen or fail to improve.  Margarita Mail, DO

## 2023-10-16 ENCOUNTER — Inpatient Hospital Stay (HOSPITAL_BASED_OUTPATIENT_CLINIC_OR_DEPARTMENT_OTHER): Payer: MEDICAID | Admitting: Internal Medicine

## 2023-10-16 ENCOUNTER — Inpatient Hospital Stay: Payer: MEDICAID

## 2023-10-16 ENCOUNTER — Encounter: Payer: Self-pay | Admitting: Internal Medicine

## 2023-10-16 ENCOUNTER — Inpatient Hospital Stay: Payer: MEDICAID | Attending: Internal Medicine

## 2023-10-16 VITALS — BP 114/76 | HR 69

## 2023-10-16 VITALS — BP 113/76 | HR 68 | Resp 18 | Ht 60.5 in | Wt 148.0 lb

## 2023-10-16 DIAGNOSIS — D509 Iron deficiency anemia, unspecified: Secondary | ICD-10-CM | POA: Insufficient documentation

## 2023-10-16 DIAGNOSIS — D649 Anemia, unspecified: Secondary | ICD-10-CM | POA: Diagnosis not present

## 2023-10-16 DIAGNOSIS — R110 Nausea: Secondary | ICD-10-CM | POA: Diagnosis not present

## 2023-10-16 DIAGNOSIS — D500 Iron deficiency anemia secondary to blood loss (chronic): Secondary | ICD-10-CM

## 2023-10-16 LAB — FERRITIN: Ferritin: 57 ng/mL (ref 11–307)

## 2023-10-16 LAB — CMP (CANCER CENTER ONLY)
ALT: 13 U/L (ref 0–44)
AST: 14 U/L — ABNORMAL LOW (ref 15–41)
Albumin: 4 g/dL (ref 3.5–5.0)
Alkaline Phosphatase: 49 U/L (ref 38–126)
Anion gap: 7 (ref 5–15)
BUN: 17 mg/dL (ref 6–20)
CO2: 24 mmol/L (ref 22–32)
Calcium: 8.9 mg/dL (ref 8.9–10.3)
Chloride: 103 mmol/L (ref 98–111)
Creatinine: 0.64 mg/dL (ref 0.44–1.00)
GFR, Estimated: >60 mL/min (ref >60)
Glucose, Bld: 102 mg/dL — ABNORMAL HIGH (ref 70–99)
Potassium: 4 mmol/L (ref 3.5–5.1)
Sodium: 134 mmol/L — ABNORMAL LOW (ref 135–145)
Total Bilirubin: 0.6 mg/dL (ref 0.0–1.2)
Total Protein: 6.9 g/dL (ref 6.5–8.1)

## 2023-10-16 LAB — CBC WITH DIFFERENTIAL (CANCER CENTER ONLY)
Abs Immature Granulocytes: 0.03 10*3/uL (ref 0.00–0.07)
Basophils Absolute: 0.1 10*3/uL (ref 0.0–0.1)
Basophils Relative: 1 %
Eosinophils Absolute: 0.2 10*3/uL (ref 0.0–0.5)
Eosinophils Relative: 2 %
HCT: 34.1 % — ABNORMAL LOW (ref 36.0–46.0)
Hemoglobin: 10.4 g/dL — ABNORMAL LOW (ref 12.0–15.0)
Immature Granulocytes: 0 %
Lymphocytes Relative: 40 %
Lymphs Abs: 2.7 10*3/uL (ref 0.7–4.0)
MCH: 18.7 pg — ABNORMAL LOW (ref 26.0–34.0)
MCHC: 30.5 g/dL (ref 30.0–36.0)
MCV: 61.2 fL — ABNORMAL LOW (ref 80.0–100.0)
Monocytes Absolute: 0.5 10*3/uL (ref 0.1–1.0)
Monocytes Relative: 7 %
Neutro Abs: 3.3 10*3/uL (ref 1.7–7.7)
Neutrophils Relative %: 50 %
Platelet Count: 246 10*3/uL (ref 150–400)
RBC: 5.57 MIL/uL — ABNORMAL HIGH (ref 3.87–5.11)
RDW: 17.8 % — ABNORMAL HIGH (ref 11.5–15.5)
WBC Count: 6.7 10*3/uL (ref 4.0–10.5)
nRBC: 0 % (ref 0.0–0.2)

## 2023-10-16 LAB — IRON AND TIBC
Iron: 69 ug/dL (ref 28–170)
Saturation Ratios: 19 % (ref 10.4–31.8)
TIBC: 361 ug/dL (ref 250–450)
UIBC: 292 ug/dL

## 2023-10-16 MED ORDER — IRON SUCROSE 20 MG/ML IV SOLN
200.0000 mg | Freq: Once | INTRAVENOUS | Status: AC
  Administered 2023-10-16: 200 mg via INTRAVENOUS

## 2023-10-16 MED ORDER — SODIUM CHLORIDE 0.9% FLUSH
10.0000 mL | Freq: Once | INTRAVENOUS | Status: AC | PRN
  Administered 2023-10-16: 10 mL
  Filled 2023-10-16: qty 10

## 2023-10-16 NOTE — Assessment & Plan Note (Addendum)
#  Symptomatic anemia-multifactorial iron deficiency [thalassemia/menstrual]- ]iron deficiency s/p IV iron infusion.   #  Recommend proceed IV iron infusion given Hb 10.4- on going  menstruating- proceed with venofer today  # Abdominal pain [endomtriosis]:? Iron pills vs other. JUNE 2023- [place] - fatty liver-  stable; also awaiting GI evaluation.  # Swelling of the neck lymph nodes-none noted today.  Defer to PCP as patient not tolerate antibiotics.  # Chronic fatigue: ? Anemia vs others-defer to PCP for further evaluation.  # DISPOSITION: # Interpreter #  venofer today # in follow up 6 months-  with MD- labs-cbc/bmp; iron studies/ferritin-- possible venofer-Dr.B

## 2023-10-16 NOTE — Progress Notes (Signed)
 [facility] CONSULT NOTE  Patient Care Team: Margarita Mail, DO as PCP - General (Internal Medicine) Earna Coder, MD as Consulting Physician (Oncology)  CHIEF COMPLAINTS/PURPOSE OF CONSULTATION: Iron deficient anemia  #Iron deficient anemia/thalassemia-pregnancy/delivery end of September 2022.   #History of syncope- s/p monitor cardiac evaluation  HISTORY OF PRESENTING ILLNESS: Ambulating independently.  Accompanied by interpreter/Host.   Candice Walker 38 y.o.  female patient from [place]-with chronic anemia/thalassemia- and prior Hx of iron deficiency [pregnancy [June 2022] here for follow-up.   In the interim evaluated by PCP for c/o lymph nodes in neck hurting x20 days.  Prescribed antibiotics however unable to take because of GI discomfort.  No fever no chills no weight loss.  Complains of ongoing fatigue.  S/p evaluation with Gyn re: endometriosis for abdominal pain.  Also currently awaiting GI evaluation given her nausea and abdominal discomfort.  Complains on going fatigue/ and Light headedness. C/o feeling like her heart is racing x1 month.   Patient complains of abdominal discomfit secondary to iron with nausea. Stopped PO iron. Currently menstrual cycles- are better since visit with Gyn.   Review of Systems  Constitutional:  Positive for malaise/fatigue. Negative for chills, diaphoresis, fever and weight loss.  HENT:  Negative for nosebleeds and sore throat.   Eyes:  Negative for double vision.  Respiratory:  Negative for cough, hemoptysis, sputum production, shortness of breath and wheezing.   Cardiovascular:  Negative for chest pain, palpitations, orthopnea and leg swelling.  Gastrointestinal:  Negative for abdominal pain, blood in stool, constipation, diarrhea, heartburn, melena, nausea and vomiting.  Genitourinary:  Negative for dysuria, frequency and urgency.  Musculoskeletal:  Negative for back pain and joint pain.  Skin: Negative.   Negative for itching and rash.  Neurological:  Negative for dizziness, tingling, focal weakness, weakness and headaches.  Endo/Heme/Allergies:  Does not bruise/bleed easily.  Psychiatric/Behavioral:  Negative for depression. The patient is not nervous/anxious and does not have insomnia.      MEDICAL HISTORY:  Past Medical History:  Diagnosis Date   Anemia    Beta thalassemia (HCC)    GERD (gastroesophageal reflux disease)    Gestational diabetes    Hypertension     SURGICAL HISTORY: Past Surgical History:  Procedure Laterality Date   TUBAL LIGATION Bilateral 04/30/2021   Procedure: POST PARTUM TUBAL LIGATION;  Surgeon: Christeen Douglas, MD;  Location: [facility] ORS;  Service: Gynecology;  Laterality: Bilateral;    SOCIAL HISTORY: Social History   Socioeconomic History   Marital status: Married    Spouse name: Yama   Number of children: 6   Years of education: Not on file   Highest education level: Not on file  Occupational History   Not on file  Tobacco Use   Smoking status: Never    Passive exposure: Never   Smokeless tobacco: Never  Vaping Use   Vaping status: Never Used  Substance and Sexual Activity   Alcohol use: Never   Drug use: Never   Sexual activity: Yes    Birth control/protection: Surgical  Other Topics Concern   Not on file  Social History Narrative   Patient previously lived in [place] and left as refugee. Resided in refugee camp in [place], then in [place] and now lives in [place] with a host family along with her husband and children.    Social Drivers of Corporate investment banker Strain: Not on file  Food Insecurity: Not on file  Transportation Needs: Not on file  Physical  Activity: Not on file  Stress: Not on file  Social Connections: Not on file  Intimate Partner Violence: Not on file    FAMILY HISTORY: Family History  Problem Relation Age of Onset   Hypertension Mother    Diabetes Father    Diabetes Paternal Grandmother      ALLERGIES:  has no known allergies.  MEDICATIONS:  Current Outpatient Medications  Medication Sig Dispense Refill   escitalopram (LEXAPRO) 10 MG tablet Take 10 mg by mouth daily.     lisinopril (ZESTRIL) 10 MG tablet Take 10 mg by mouth daily.     metoprolol succinate (TOPROL-XL) 25 MG 24 hr tablet Take 25 mg by mouth daily.     pantoprazole (PROTONIX) 40 MG tablet Take 40 mg by mouth daily.     No current facility-administered medications for this visit.      Marland Kitchen  PHYSICAL EXAMINATION:  Vitals:   10/16/23 1046  BP: 113/76  Pulse: 68  Resp: 18  SpO2: 98%     Filed Weights   10/16/23 1046  Weight: 148 lb (67.1 kg)      Physical Exam Vitals and nursing note reviewed.  HENT:     Head: Normocephalic and atraumatic.     Mouth/Throat:     Pharynx: Oropharynx is clear.  Eyes:     Extraocular Movements: Extraocular movements intact.     Pupils: Pupils are equal, round, and reactive to light.  Cardiovascular:     Rate and Rhythm: Normal rate and regular rhythm.  Pulmonary:     Comments: Decreased breath sounds bilaterally.  Abdominal:     Palpations: Abdomen is soft.  Musculoskeletal:        General: Normal range of motion.     Cervical back: Normal range of motion.  Skin:    General: Skin is warm.  Neurological:     General: No focal deficit present.     Mental Status: She is alert and oriented to person, place, and time.  Psychiatric:        Behavior: Behavior normal.        Judgment: Judgment normal.     LABORATORY DATA:  I have reviewed the data as listed Lab Results  Component Value Date   WBC 6.7 10/16/2023   HGB 10.4 (L) 10/16/2023   HCT 34.1 (L) 10/16/2023   MCV 61.2 (L) 10/16/2023   PLT 246 10/16/2023   Recent Labs    11/27/22 1319 06/03/23 1245 10/16/23 1034  NA 139 136 134*  K 3.8 3.5 4.0  CL 108 105 103  CO2 25 25 24   GLUCOSE 100* 165* 102*  BUN 15 10 17   CREATININE 1.07* 0.55 0.64  CALCIUM 8.9 8.8* 8.9  GFRNONAA >60 >60 >60   PROT  --   --  6.9  ALBUMIN  --   --  4.0  AST  --   --  14*  ALT  --   --  13  ALKPHOS  --   --  49  BILITOT  --   --  0.6    RADIOGRAPHIC STUDIES: I have personally reviewed the radiological images as listed and agreed with the findings in the report. No results found.  Symptomatic anemia #Symptomatic anemia-multifactorial iron deficiency [thalassemia/menstrual]- ]iron deficiency s/p IV iron infusion.   #  Recommend proceed IV iron infusion given Hb 10.4- on going  menstruating- proceed with venofer today  # Abdominal pain [endomtriosis]:? Iron pills vs other. JUNE 2023- [place] - fatty liver-  stable;  also awaiting GI evaluation.  # Swelling of the neck lymph nodes-none noted today.  Defer to PCP as patient not tolerate antibiotics.  # Chronic fatigue: ? Anemia vs others-defer to PCP for further evaluation.  # DISPOSITION: # Interpreter #  venofer today # in follow up 6 months-  with MD- labs-cbc/bmp; iron studies/ferritin-- possible venofer-Dr.B        Earna Coder, MD 10/16/2023 12:06 PM

## 2023-10-16 NOTE — Progress Notes (Signed)
 Fatigue/weakness: yes Dyspena: no Light headedness: yes Blood in stool: no  C/o lymph nodes in neck hurting x20 days. She was trying to vomit but nothing came out but blood.  C/o feeling like her heart is racing x1 month.  Pt states she is not taking the iron, upsets her stomach.

## 2023-12-02 ENCOUNTER — Ambulatory Visit: Payer: MEDICAID

## 2023-12-02 ENCOUNTER — Ambulatory Visit: Payer: MEDICAID | Admitting: Oncology

## 2023-12-02 ENCOUNTER — Other Ambulatory Visit: Payer: MEDICAID

## 2023-12-15 ENCOUNTER — Ambulatory Visit: Payer: MEDICAID

## 2023-12-15 ENCOUNTER — Other Ambulatory Visit: Payer: MEDICAID

## 2023-12-15 ENCOUNTER — Ambulatory Visit: Payer: MEDICAID | Admitting: Internal Medicine

## 2024-03-03 NOTE — OR Nursing (Signed)
 Pt, family member, and son called to get pt's arrival time, gave them admin # to confirm coverage, billing per request. Confirmed the patients native language is on our interpreter device.

## 2024-03-04 ENCOUNTER — Encounter: Payer: Self-pay | Admitting: Gastroenterology

## 2024-03-04 ENCOUNTER — Ambulatory Visit
Admission: RE | Admit: 2024-03-04 | Discharge: 2024-03-04 | Disposition: A | Discharge status: Home / Self Care | Payer: MEDICAID | Attending: Gastroenterology | Admitting: Gastroenterology

## 2024-03-04 ENCOUNTER — Ambulatory Visit: Payer: MEDICAID | Admitting: Certified Registered"

## 2024-03-04 ENCOUNTER — Encounter
Admission: RE | Disposition: A | Discharge status: Home / Self Care | Payer: Self-pay | Source: Home / Self Care | Attending: Gastroenterology

## 2024-03-04 ENCOUNTER — Other Ambulatory Visit: Payer: Self-pay

## 2024-03-04 DIAGNOSIS — K2970 Gastritis, unspecified, without bleeding: Secondary | ICD-10-CM | POA: Diagnosis not present

## 2024-03-04 DIAGNOSIS — K2100 Gastro-esophageal reflux disease with esophagitis, without bleeding: Secondary | ICD-10-CM | POA: Diagnosis not present

## 2024-03-04 DIAGNOSIS — D561 Beta thalassemia: Secondary | ICD-10-CM | POA: Diagnosis not present

## 2024-03-04 DIAGNOSIS — D509 Iron deficiency anemia, unspecified: Secondary | ICD-10-CM | POA: Insufficient documentation

## 2024-03-04 DIAGNOSIS — R1013 Epigastric pain: Secondary | ICD-10-CM | POA: Diagnosis present

## 2024-03-04 DIAGNOSIS — K3189 Other diseases of stomach and duodenum: Secondary | ICD-10-CM | POA: Diagnosis not present

## 2024-03-04 DIAGNOSIS — Z79899 Other long term (current) drug therapy: Secondary | ICD-10-CM | POA: Diagnosis not present

## 2024-03-04 DIAGNOSIS — B9681 Helicobacter pylori [H. pylori] as the cause of diseases classified elsewhere: Secondary | ICD-10-CM | POA: Diagnosis not present

## 2024-03-04 DIAGNOSIS — I10 Essential (primary) hypertension: Secondary | ICD-10-CM | POA: Insufficient documentation

## 2024-03-04 HISTORY — PX: COLONOSCOPY: SHX5424

## 2024-03-04 HISTORY — PX: ESOPHAGOGASTRODUODENOSCOPY: SHX5428

## 2024-03-04 LAB — POCT PREGNANCY, URINE: Preg Test, Ur: NEGATIVE

## 2024-03-04 LAB — GLUCOSE, CAPILLARY: Glucose-Capillary: 112 mg/dL — ABNORMAL HIGH (ref 70–99)

## 2024-03-04 SURGERY — COLONOSCOPY
Anesthesia: General

## 2024-03-04 MED ORDER — LIDOCAINE HCL (CARDIAC) PF 100 MG/5ML IV SOSY
PREFILLED_SYRINGE | INTRAVENOUS | Status: DC | PRN
  Administered 2024-03-04: 80 mg via INTRAVENOUS

## 2024-03-04 MED ORDER — SODIUM CHLORIDE 0.9 % IV SOLN: INTRAVENOUS | Status: DC

## 2024-03-04 MED ORDER — DEXMEDETOMIDINE HCL IN NACL 80 MCG/20ML IV SOLN
INTRAVENOUS | Status: DC | PRN
  Administered 2024-03-04: 12 ug via INTRAVENOUS

## 2024-03-04 MED ORDER — PROPOFOL 500 MG/50ML IV EMUL
INTRAVENOUS | Status: DC | PRN
  Administered 2024-03-04: 130 ug/kg/min via INTRAVENOUS

## 2024-03-04 MED ORDER — PROPOFOL 10 MG/ML IV BOLUS
INTRAVENOUS | Status: DC | PRN
  Administered 2024-03-04: 100 mg via INTRAVENOUS
  Administered 2024-03-04: 40 mg via INTRAVENOUS
  Administered 2024-03-04: 60 mg via INTRAVENOUS

## 2024-03-04 MED ORDER — SUCRALFATE 1 G PO TABS: 1.0000 g | ORAL_TABLET | Freq: Four times a day (QID) | ORAL | 0 refills | Status: AC

## 2024-03-04 NOTE — H&P (Signed)
 Corinn JONELLE Brooklyn, MD [facility], DHIP [address]  [place], [place] [address]  Main: [phone] Fax:  [phone] Pager: [phone]   Primary Care Physician:  Bernardo Fend, DO Primary Gastroenterologist:  Dr. Corinn JONELLE Brooklyn  Pre-Procedure History & Physical: HPI:  Candice Walker is a 38 y.o. female is here for an endoscopy and colonoscopy.   Past Medical History:  Diagnosis Date   Anemia    Beta thalassemia (HCC)    GERD (gastroesophageal reflux disease)    Gestational diabetes    Hypertension     Past Surgical History:  Procedure Laterality Date   TUBAL LIGATION Bilateral 04/30/2021   Procedure: POST PARTUM TUBAL LIGATION;  Surgeon: Verdon Keen, MD;  Location: [facility] ORS;  Service: Gynecology;  Laterality: Bilateral;    Prior to Admission medications   Medication Sig Start Date End Date Taking? Authorizing Provider  escitalopram  (LEXAPRO ) 10 MG tablet Take 10 mg by mouth daily. 05/11/22   [provider]  lisinopril (ZESTRIL) 10 MG tablet Take 10 mg by mouth daily. 04/09/23   [provider]  metoprolol succinate (TOPROL-XL) 25 MG 24 hr tablet Take 25 mg by mouth daily. 04/09/23   [provider]  pantoprazole  (PROTONIX ) 40 MG tablet Take 40 mg by mouth daily. 04/09/23   [provider]    Allergies as of 02/10/2024   (No Known Allergies)    Family History  Problem Relation Age of Onset   Hypertension Mother    Diabetes Father    Diabetes Paternal Grandmother     Social History   Socioeconomic History   Marital status: Married    Spouse name: Yama   Number of children: 6   Years of education: Not on file   Highest education level: Not on file  Occupational History   Not on file  Tobacco Use   Smoking status: Never    Passive exposure: Never   Smokeless tobacco: Never  Vaping Use   Vaping status: Never Used  Substance and Sexual Activity   Alcohol use: Never   Drug use: Never    Sexual activity: Yes    Birth control/protection: Surgical  Other Topics Concern   Not on file  Social History Narrative   Patient previously lived in [place] and left as refugee. Resided in refugee camp in [place], then in [place]  and now lives in [place] with a host family along with her husband and children.    Social Drivers of Health   Financial Resource Strain: Medium Risk (02/10/2024)   Received from [facility]   Overall Financial Resource Strain (CARDIA)    Difficulty of Paying Living Expenses: Somewhat hard  Food Insecurity: Food Insecurity Present (02/10/2024)   Received [facility]   Hunger Vital Sign    Within the past 12 months, you worried that your food would run out before you got the money to buy more.: Sometimes true    Within the past 12 months, the food you bought just didn't last and you didn't have money to get more.: Never true  Transportation Needs: Unmet Transportation Needs (02/10/2024)   Received from [facility] - Transportation    In the past 12 months, has lack of transportation kept you from medical appointments or from getting medications?: Yes    Lack of Transportation (Non-Medical): Yes  Physical Activity: Not on file  Stress: Not on file  Social Connections: Not on file  Intimate Partner Violence: Not on file    Review of Systems: See HPI, otherwise negative ROS  Physical Exam: BP 100/68   Pulse 70   Temp (!) 96 F (35.6 C) (Temporal)   Resp 18   Ht 5' 4 (1.626 m)   Wt 146 lb 12.8 oz (66.6 kg)   SpO2 96%   BMI 25.20 kg/m  General:   Alert,  pleasant and cooperative in NAD Head:  Normocephalic and atraumatic. Neck:  Supple; no masses or thyromegaly. Lungs:  Clear throughout to auscultation.    Heart:  Regular rate and rhythm. Abdomen:  Soft, nontender and nondistended. Normal bowel sounds, without guarding, and without rebound.   Neurologic:  Alert and  oriented x4;   grossly normal neurologically.  Impression/Plan: Ixel Lingle is here for an endoscopy and colonoscopy to be performed for IDA, h/o H Pylori  Risks, benefits, limitations, and alternatives regarding  endoscopy and colonoscopy have been reviewed with the patient.  Questions have been answered.  All parties agreeable.   Corinn Brooklyn, MD  03/04/2024, 12:03 PM

## 2024-03-04 NOTE — Anesthesia Preprocedure Evaluation (Signed)
 Anesthesia Evaluation  Patient identified by MRN, date of birth, ID band Patient awake    Reviewed: Allergy & Precautions, NPO status , Patient's Chart, lab work & pertinent test results  Airway Mallampati: II  TM Distance: >3 FB Neck ROM: full    Dental  (+) Teeth Intact   Pulmonary neg pulmonary ROS   Pulmonary exam normal        Cardiovascular Exercise Tolerance: Good hypertension, Pt. on medications negative cardio ROS Normal cardiovascular exam Rhythm:Regular Rate:Normal     Neuro/Psych    Depression    negative neurological ROS  negative psych ROS   GI/Hepatic negative GI ROS, Neg liver ROS,GERD  Medicated,,  Endo/Other  negative endocrine ROSdiabetes, Type 2    Renal/GU negative Renal ROS  negative genitourinary   Musculoskeletal   Abdominal Normal abdominal exam  (+)   Peds negative pediatric ROS (+)  Hematology negative hematology ROS (+) Blood dyscrasia, anemia   Anesthesia Other Findings Past Medical History: No date: Anemia No date: Beta thalassemia (HCC) No date: GERD (gastroesophageal reflux disease) No date: Gestational diabetes No date: Hypertension  Past Surgical History: 04/30/2021: TUBAL LIGATION; Bilateral     Comment:  Procedure: POST PARTUM TUBAL LIGATION;  Surgeon:               Verdon Keen, MD;  Location: [facility] ORS;  Service:               Gynecology;  Laterality: Bilateral;  BMI    Body Mass Index: 25.20 kg/m      Reproductive/Obstetrics negative OB ROS                              Anesthesia Physical Anesthesia Plan  ASA: 2  Anesthesia Plan: General   Post-op Pain Management:    Induction: Intravenous  PONV Risk Score and Plan: Propofol  infusion and TIVA  Airway Management Planned: Natural Airway and Nasal Cannula  Additional Equipment:   Intra-op Plan:   Post-operative Plan:   Informed Consent: I have reviewed the patients  History and Physical, chart, labs and discussed the procedure including the risks, benefits and alternatives for the proposed anesthesia with the patient or authorized representative who has indicated his/her understanding and acceptance.     Dental Advisory Given  Plan Discussed with: CRNA  Anesthesia Plan Comments:         Anesthesia Quick Evaluation

## 2024-03-04 NOTE — Transfer of Care (Signed)
 Immediate Anesthesia Transfer of Care Note  Patient: Candice Walker  Procedure(s) Performed: COLONOSCOPY EGD (ESOPHAGOGASTRODUODENOSCOPY)  Patient Location: Endoscopy Unit  Anesthesia Type:General  Level of Consciousness: drowsy  Airway & Oxygen Therapy: Patient Spontanous Breathing  Post-op Assessment: Report given to RN  Post vital signs: stable  Last Vitals:  Vitals Value Taken Time  BP    Temp    Pulse    Resp    SpO2      Last Pain:  Vitals:   03/04/24 1041  TempSrc: Temporal  PainSc: 0-No pain         Complications: No notable events documented.

## 2024-03-04 NOTE — Anesthesia Postprocedure Evaluation (Signed)
 Anesthesia Post Note  Patient: Candice Walker  Procedure(s) Performed: COLONOSCOPY EGD (ESOPHAGOGASTRODUODENOSCOPY)  Patient location during evaluation: PACU Anesthesia Type: General Level of consciousness: awake Pain management: satisfactory to patient Vital Signs Assessment: post-procedure vital signs reviewed and stable Respiratory status: spontaneous breathing Cardiovascular status: blood pressure returned to baseline Anesthetic complications: no   No notable events documented.   Last Vitals:  Vitals:   03/04/24 1150 03/04/24 1200  BP: 100/68 102/63  Pulse: 70 62  Resp: 18 18  Temp:    SpO2: 96% 98%    Last Pain:  Vitals:   03/04/24 1200  TempSrc:   PainSc: 0-No pain                 VAN STAVEREN,Jenet Durio

## 2024-03-04 NOTE — Op Note (Signed)
 [facility] Gastroenterology Patient Name: Candice Walker Procedure Date: 03/04/2024 10:55 AM MRN: [id number] Account #: [id number] Date of Birth: 1986-01-02 Admit Type: Outpatient Age: 38 Room: [facility] ENDO ROOM 3 Gender: Female Note Status: Finalized Instrument Name: Peds Colonoscope [id number] Procedure:             Colonoscopy Indications:           This is the patient's first colonoscopy, Iron                          deficiency anemia Providers:             Corinn Jess Brooklyn MD, MD Referring MD:          Sharyle Fischer (Referring MD) Medicines:             General Anesthesia Complications:         No immediate complications. Estimated blood loss: None. Procedure:             Pre-Anesthesia Assessment:                        - Prior to the procedure, a History and Physical was                         performed, and patient medications and allergies were                         reviewed. The patient is competent. The risks and                         benefits of the procedure and the sedation options and                         risks were discussed with the patient. All questions                         were answered and informed consent was obtained.                         Patient identification and proposed procedure were                         verified by the physician, the nurse, the                         anesthesiologist, the anesthetist and the technician                         in the pre-procedure area in the procedure room in the                         endoscopy suite. Mental Status Examination: alert and                         oriented. Airway Examination: normal oropharyngeal                         airway and neck mobility. Respiratory Examination:  clear to auscultation. CV Examination: normal.                         Prophylactic Antibiotics: The patient does not require                         prophylactic antibiotics.  Prior Anticoagulants: The                         patient has taken no anticoagulant or antiplatelet                         agents. ASA Grade Assessment: II - A patient with mild                         systemic disease. After reviewing the risks and                         benefits, the patient was deemed in satisfactory                         condition to undergo the procedure. The anesthesia                         plan was to use general anesthesia. Immediately prior                         to administration of medications, the patient was                         re-assessed for adequacy to receive sedatives. The                         heart rate, respiratory rate, oxygen saturations,                         blood pressure, adequacy of pulmonary ventilation, and                         response to care were monitored throughout the                         procedure. The physical status of the patient was                         re-assessed after the procedure.                        After obtaining informed consent, the colonoscope was                         passed under direct vision. Throughout the procedure,                         the patient's blood pressure, pulse, and oxygen                         saturations were monitored continuously. The  Colonoscope was introduced through the anus and                         advanced to the the terminal ileum, with                         identification of the appendiceal orifice. The                         colonoscopy was performed without difficulty. The                         patient tolerated the procedure well. The quality of                         the bowel preparation was good. The terminal ileum,                         ileocecal valve, appendiceal orifice, and rectum were                         photographed. Findings:      The perianal and digital rectal examinations were normal. Pertinent        negatives include normal sphincter tone and no palpable rectal lesions.      The terminal ileum appeared normal.      The entire examined colon appeared normal.      The retroflexed view of the distal rectum and anal verge was normal and       showed no anal or rectal abnormalities. Impression:            - The examined portion of the ileum was normal.                        - The entire examined colon is normal.                        - The distal rectum and anal verge are normal on                         retroflexion view.                        - No specimens collected. Recommendation:        - Discharge patient to home (with spouse).                        - Resume regular diet today.                        - Continue present medications. Procedure Code(s):     --- Professional ---                        [phone], Colonoscopy, flexible; diagnostic, including                         collection of specimen(s) by brushing or washing, when  performed (separate procedure) Diagnosis Code(s):     --- Professional ---                        D50.9, Iron  deficiency anemia, unspecified CPT copyright 2022 American Medical Association. All rights reserved. The codes documented in this report are preliminary and upon coder review may  be revised to meet current compliance requirements. Dr. Corinn Brooklyn Corinn Jess Brooklyn MD, MD 03/04/2024 11:45:02 AM This report has been signed electronically. Number of Addenda: 0 Note Initiated On: 03/04/2024 10:55 AM Scope Withdrawal Time: 0 hours 5 minutes 36 seconds  Total Procedure Duration: 0 hours 6 minutes 50 seconds  Estimated Blood Loss:  Estimated blood loss: none.      [facility]

## 2024-03-04 NOTE — Op Note (Addendum)
 [facility] Gastroenterology Patient Name: Candice Walker Procedure Date: 03/04/2024 10:59 AM MRN: [id number] Account #: [id number] Date of Birth: [birth year] Admit Type: Outpatient Age: 38 Room: [facility] ENDO ROOM 3 Gender: Female Note Status: Supervisor Override Instrument Name: Barnie Endoscope [id number] Procedure:             Upper GI endoscopy Indications:           Epigastric abdominal pain, Unexplained iron  deficiency                         anemia, Esophageal reflux symptoms that persist                         despite appropriate therapy Providers:             Corinn Jess Brooklyn MD, MD Referring MD:          bernardo Medicines:             General Anesthesia Complications:         No immediate complications. Procedure:             Pre-Anesthesia Assessment:                        - Prior to the procedure, a History and Physical was                         performed, and patient medications and allergies were                         reviewed. The patient is competent. The risks and                         benefits of the procedure and the sedation options and                         risks were discussed with the patient. All questions                         were answered and informed consent was obtained.                         Patient identification and proposed procedure were                         verified by the physician, the nurse, the                         anesthesiologist, the anesthetist and the technician                         in the pre-procedure area in the procedure room in the                         endoscopy suite. Mental Status Examination: alert and                         oriented. Airway Examination: normal oropharyngeal  airway and neck mobility. Respiratory Examination:                         clear to auscultation. CV Examination: normal.                         Prophylactic Antibiotics: The patient does not  require                         prophylactic antibiotics. Prior Anticoagulants: The                         patient has taken no anticoagulant or antiplatelet                         agents. ASA Grade Assessment: II - A patient with mild                         systemic disease. After reviewing the risks and                         benefits, the patient was deemed in satisfactory                         condition to undergo the procedure. The anesthesia                         plan was to use general anesthesia. Immediately prior                         to administration of medications, the patient was                         re-assessed for adequacy to receive sedatives. The                         heart rate, respiratory rate, oxygen saturations,                         blood pressure, adequacy of pulmonary ventilation, and                         response to care were monitored throughout the                         procedure. The physical status of the patient was                         re-assessed after the procedure.                        After obtaining informed consent, the endoscope was                         passed under direct vision. Throughout the procedure,                         the patient's blood pressure, pulse, and oxygen  saturations were monitored continuously. The                         Endosonoscope was introduced through the mouth, and                         advanced to the second part of duodenum. The upper GI                         endoscopy was accomplished without difficulty. The                         patient tolerated the procedure well. Findings:      The duodenal bulb and second portion of the duodenum were normal.       Biopsies were taken with a cold forceps for histology.      Patchy mildly erythematous mucosa without bleeding was found in the       gastric fundus and in the gastric body. Biopsies were taken with a cold        forceps for histology.      The incisura and gastric antrum were normal. Biopsies were taken with a       cold forceps for histology.      Esophagogastric landmarks were identified: the gastroesophageal junction       was found at 36 cm from the incisors.      The gastroesophageal junction and examined esophagus were normal.       Biopsies were taken with a cold forceps for histology. Impression:            - Normal duodenal bulb and second portion of the                         duodenum. Biopsied.                        - Erythematous mucosa in the gastric fundus and                         gastric body. Biopsied.                        - Normal incisura and antrum. Biopsied.                        - Esophagogastric landmarks identified.                        - Normal gastroesophageal junction and esophagus.                         Biopsied. Recommendation:        - Await pathology results.                        - Follow an antireflux regimen.                        - Continue present medications.                        - Proceed with colonoscopy as scheduled  See colonoscopy report Procedure Code(s):     --- Professional ---                        [phone], Esophagogastroduodenoscopy, flexible,                         transoral; with biopsy, single or multiple Diagnosis Code(s):     --- Professional ---                        K31.89, Other diseases of stomach and duodenum                        D50.9, Iron  deficiency anemia, unspecified                        R10.13, Epigastric pain                        K21.9, Gastro-esophageal reflux disease without                         esophagitis CPT copyright 2022 American Medical Association. All rights reserved. The codes documented in this report are preliminary and upon coder review may  be revised to meet current compliance requirements. Dr. Corinn Brooklyn Corinn Jess Brooklyn MD, MD 03/04/2024 11:29:30 AM This  report has been signed electronically. Number of Addenda: 0 Note Initiated On: 03/04/2024 10:59 AM Estimated Blood Loss:  Estimated blood loss: none.      [facility]

## 2024-03-05 LAB — SURGICAL PATHOLOGY

## 2024-03-09 ENCOUNTER — Ambulatory Visit: Payer: Self-pay | Admitting: Gastroenterology

## 2024-03-09 NOTE — Progress Notes (Signed)
 Please inform patient that the pathology results from upper endoscopy confirm H. pylori gastritis Recommend treatment for it with quadruple therapy to treat H Pylori for 14 days  Omeprazole  20mg  BID before meals Bismuth subsalicylate 300 mg 4 times daily Tetracycline 500 mg 4 times daily  Metronidazole 500 mg 3 times daily    Order H Pylori breath test in 4weeks after completing medication to confirm eradication.  Patient should be off prilosec and H2 blocker atleast for 2weeks before the test  Thanks RV

## 2024-04-23 ENCOUNTER — Inpatient Hospital Stay: Payer: MEDICAID | Attending: Internal Medicine

## 2024-04-23 ENCOUNTER — Encounter: Payer: Self-pay | Admitting: Internal Medicine

## 2024-04-23 ENCOUNTER — Inpatient Hospital Stay (HOSPITAL_BASED_OUTPATIENT_CLINIC_OR_DEPARTMENT_OTHER): Payer: MEDICAID | Admitting: Internal Medicine

## 2024-04-23 ENCOUNTER — Inpatient Hospital Stay: Payer: MEDICAID

## 2024-04-23 VITALS — BP 136/101 | HR 80 | Temp 98.1°F | Resp 18 | Ht 64.0 in | Wt 148.1 lb

## 2024-04-23 VITALS — BP 143/93 | HR 72 | Resp 19

## 2024-04-23 DIAGNOSIS — D509 Iron deficiency anemia, unspecified: Secondary | ICD-10-CM | POA: Diagnosis present

## 2024-04-23 DIAGNOSIS — D649 Anemia, unspecified: Secondary | ICD-10-CM

## 2024-04-23 LAB — CBC WITH DIFFERENTIAL (CANCER CENTER ONLY)
Abs Immature Granulocytes: 0.03 K/uL (ref 0.00–0.07)
Basophils Absolute: 0 K/uL (ref 0.0–0.1)
Basophils Relative: 0 %
Eosinophils Absolute: 0.2 K/uL (ref 0.0–0.5)
Eosinophils Relative: 3 %
HCT: 33.6 % — ABNORMAL LOW (ref 36.0–46.0)
Hemoglobin: 10.3 g/dL — ABNORMAL LOW (ref 12.0–15.0)
Immature Granulocytes: 0 %
Lymphocytes Relative: 36 %
Lymphs Abs: 2.5 K/uL (ref 0.7–4.0)
MCH: 18.5 pg — ABNORMAL LOW (ref 26.0–34.0)
MCHC: 30.7 g/dL (ref 30.0–36.0)
MCV: 60.2 fL — ABNORMAL LOW (ref 80.0–100.0)
Monocytes Absolute: 0.5 K/uL (ref 0.1–1.0)
Monocytes Relative: 7 %
Neutro Abs: 3.7 K/uL (ref 1.7–7.7)
Neutrophils Relative %: 54 %
Platelet Count: 250 K/uL (ref 150–400)
RBC: 5.58 MIL/uL — ABNORMAL HIGH (ref 3.87–5.11)
RDW: 17.4 % — ABNORMAL HIGH (ref 11.5–15.5)
Smear Review: NORMAL
WBC Count: 6.9 K/uL (ref 4.0–10.5)
nRBC: 0 % (ref 0.0–0.2)

## 2024-04-23 LAB — BASIC METABOLIC PANEL WITH GFR
Anion gap: 6 (ref 5–15)
BUN: 10 mg/dL (ref 6–20)
CO2: 23 mmol/L (ref 22–32)
Calcium: 8.5 mg/dL — ABNORMAL LOW (ref 8.9–10.3)
Chloride: 102 mmol/L (ref 98–111)
Creatinine, Ser: 0.7 mg/dL (ref 0.44–1.00)
GFR, Estimated: >60 mL/min (ref >60)
Glucose, Bld: 106 mg/dL — ABNORMAL HIGH (ref 70–99)
Potassium: 3.8 mmol/L (ref 3.5–5.1)
Sodium: 131 mmol/L — ABNORMAL LOW (ref 135–145)

## 2024-04-23 LAB — IRON AND TIBC
Iron: 52 ug/dL (ref 28–170)
Saturation Ratios: 15 % (ref 10.4–31.8)
TIBC: 351 ug/dL (ref 250–450)
UIBC: 299 ug/dL

## 2024-04-23 LAB — FERRITIN: Ferritin: 51 ng/mL (ref 11–307)

## 2024-04-23 MED ORDER — IRON SUCROSE 20 MG/ML IV SOLN
200.0000 mg | Freq: Once | INTRAVENOUS | Status: AC
  Administered 2024-04-23: 200 mg via INTRAVENOUS
  Filled 2024-04-23: qty 10

## 2024-04-23 MED ORDER — SODIUM CHLORIDE 0.9% FLUSH
10.0000 mL | Freq: Once | INTRAVENOUS | Status: AC | PRN
  Administered 2024-04-23: 10 mL
  Filled 2024-04-23: qty 10

## 2024-04-23 NOTE — Patient Instructions (Signed)
#   Low B12 : discussed with patient importance optimize B12-to help with blood/and nerves.  Recommend B12 sublingual 1000 mcg once a day [under the tongue; helps with better absorption].     # Discussed regarding low calcium -and overall importance in bone health. Recommend caltrate or similar composition [calcium   600 plus vitamin D-3 -800] -over-the-counter-2 pills a day.

## 2024-04-23 NOTE — Assessment & Plan Note (Addendum)
#  Symptomatic anemia-multifactorial iron  deficiency [thalassemia/menstrual]- ]iron  deficiency s/p IV iron  infusion.   #  Recommend proceed IV iron  infusion given Hb 10.4- on going  menstruating- proceed with venofer  today  # Abdominal pain [endometriosis Dr.Beasley-IBS-Dr.Vanga ] JUNE 2023- US  - fatty liver-  s/p GI evaluation-  Colonoscopy and Endoscopy July [facility]-dietary changes recommeded by GI   # Dyspnea/chest discomfort- Chronic fatigue/myalgias- Recommend B12 supplement/ vit D supplementation.  Recommend awaiting to cardiology evaluation/appt.   # DISPOSITION: # Interpreter # venofer  today # in follow up 6 months-  with APP- labs-cbc/bmp; iron  studies/ferritin; B12; vit D- 25-OH -- possible venofer -Dr.B

## 2024-04-23 NOTE — Progress Notes (Signed)
 Fatigue/weakness: YES, and pain in her chest with walking. Exhausted 2 weeks before menstrual. Dyspena: YES/SOB Light headedness: NO Blood in stool:  NO   Colonoscopy and Endoscopy July [facility], Dr. Unk. Recommended Fod Diet for IBS.  C/o server body pain. Unable to do her chores and work at times.

## 2024-04-23 NOTE — Progress Notes (Signed)
 [facility] CONSULT NOTE  Patient Care Team: Bernardo Fend, DO as PCP - General (Internal Medicine) Rennie Cindy SAUNDERS, MD as Consulting Physician (Oncology)  CHIEF COMPLAINTS/PURPOSE OF CONSULTATION: Iron  deficient anemia  #Iron  deficient anemia/thalassemia-pregnancy/delivery end of September 2022.   #History of syncope- s/p monitor cardiac evaluation  HISTORY OF PRESENTING ILLNESS: Ambulating independently.  Accompanied by interpreter/Host.   Candice Walker 38 y.o.  female patient from [place]-with chronic anemia/thalassemia- and prior Hx of iron  deficiency [pregnancy [June 2022] here for follow-up.   Patient complains on fatigue- worsening with exertion. Notes to have chest discomfort with exertion. Continues to have heavy menstrual cycles. Complains of body aches/ joint aches.   Review of Systems  Constitutional:  Positive for malaise/fatigue. Negative for chills, diaphoresis, fever and weight loss.  HENT:  Negative for nosebleeds and sore throat.   Eyes:  Negative for double vision.  Respiratory:  Positive for shortness of breath. Negative for cough, hemoptysis and wheezing.   Cardiovascular:  Negative for chest pain, palpitations, orthopnea and leg swelling.  Gastrointestinal:  Negative for abdominal pain, blood in stool, constipation, diarrhea, heartburn, melena, nausea and vomiting.  Genitourinary:  Negative for dysuria, frequency and urgency.  Musculoskeletal:  Positive for joint pain and myalgias. Negative for back pain.  Skin: Negative.  Negative for itching and rash.  Neurological:  Negative for dizziness, tingling, focal weakness, weakness and headaches.  Endo/Heme/Allergies:  Does not bruise/bleed easily.  Psychiatric/Behavioral:  Negative for depression. The patient is not nervous/anxious and does not have insomnia.      MEDICAL HISTORY:  Past Medical History:  Diagnosis Date   Anemia    Beta thalassemia (HCC)    GERD (gastroesophageal  reflux disease)    Gestational diabetes    Hypertension     SURGICAL HISTORY: Past Surgical History:  Procedure Laterality Date   COLONOSCOPY N/A 03/04/2024   Procedure: COLONOSCOPY;  Surgeon: Unk Corinn Skiff, MD;  Location: [facility] ENDOSCOPY;  Service: Gastroenterology;  Laterality: N/A;   ESOPHAGOGASTRODUODENOSCOPY N/A 03/04/2024   Procedure: EGD (ESOPHAGOGASTRODUODENOSCOPY);  Surgeon: Unk Corinn Skiff, MD;  Location: [facility] ENDOSCOPY;  Service: Gastroenterology;  Laterality: N/A;   TUBAL LIGATION Bilateral 04/30/2021   Procedure: POST PARTUM TUBAL LIGATION;  Surgeon: Verdon Keen, MD;  Location: [facility] ORS;  Service: Gynecology;  Laterality: Bilateral;    SOCIAL HISTORY: Social History   Socioeconomic History   Marital status: Married    Spouse name: Yama   Number of children: 6   Years of education: Not on file   Highest education level: Not on file  Occupational History   Not on file  Tobacco Use   Smoking status: Never    Passive exposure: Never   Smokeless tobacco: Never  Vaping Use   Vaping status: Never Used  Substance and Sexual Activity   Alcohol use: Never   Drug use: Never   Sexual activity: Yes    Birth control/protection: Surgical  Other Topics Concern   Not on file  Social History Narrative   Patient previously lived in [place] and left as refugee. Resided in refugee camp in [place], then in [place]  and now lives in [place] with a host family along with her husband and children.    Social Drivers of Health   Financial Resource Strain: Medium Risk (02/10/2024)   Received from [facility]   Overall Financial Resource Strain (CARDIA)    Difficulty of Paying Living Expenses: Somewhat hard  Food Insecurity: Food Insecurity Present (02/10/2024)   Received from  [facility]   Hunger Vital Sign    Within the past 12 months, you worried that your food would run out before you got the money to buy more.: Sometimes true     Within the past 12 months, the food you bought just didn't last and you didn't have money to get more.: Never true  Transportation Needs: Unmet Transportation Needs (02/10/2024)   Received from [facility] - Transportation    In the past 12 months, has lack of transportation kept you from medical appointments or from getting medications?: Yes    Lack of Transportation (Non-Medical): Yes  Physical Activity: Not on file  Stress: Not on file  Social Connections: Not on file  Intimate Partner Violence: Not on file    FAMILY HISTORY: Family History  Problem Relation Age of Onset   Hypertension Mother    Diabetes Father    Diabetes Paternal Grandmother     ALLERGIES:  has no known allergies.  MEDICATIONS:  Current Outpatient Medications  Medication Sig Dispense Refill   escitalopram  (LEXAPRO ) 10 MG tablet Take 10 mg by mouth daily.     famotidine  (PEPCID ) 40 MG tablet Take 40 mg by mouth at bedtime.     ferrous sulfate 325 (65 FE) MG tablet Take 325 mg by mouth 3 (three) times a week.     lisinopril (ZESTRIL) 10 MG tablet Take 10 mg by mouth daily.     loratadine (CLARITIN) 10 MG tablet Take 10 mg by mouth daily.     omeprazole  (PRILOSEC) 20 MG capsule Take 20 mg by mouth daily.     sucralfate  (CARAFATE ) 1 g tablet Take 1 tablet (1 g total) by mouth 4 (four) times daily. 120 tablet 0   No current facility-administered medications for this visit.      SABRA  PHYSICAL EXAMINATION:  Vitals:   04/23/24 1038 04/23/24 1100  BP: (!) 130/91 (!) 136/101  Pulse: 80   Resp: 18   Temp: 98.1 F (36.7 C)   SpO2: 100%      Filed Weights   04/23/24 1038  Weight: 148 lb 1.6 oz (67.2 kg)      Physical Exam Vitals and nursing note reviewed.  HENT:     Head: Normocephalic and atraumatic.     Mouth/Throat:     Pharynx: Oropharynx is clear.  Eyes:     Extraocular Movements: Extraocular movements intact.     Pupils: Pupils are equal, round, and reactive to  light.  Cardiovascular:     Rate and Rhythm: Normal rate and regular rhythm.  Pulmonary:     Comments: Decreased breath sounds bilaterally.  Abdominal:     Palpations: Abdomen is soft.  Musculoskeletal:        General: Normal range of motion.     Cervical back: Normal range of motion.  Skin:    General: Skin is warm.  Neurological:     General: No focal deficit present.     Mental Status: She is alert and oriented to person, place, and time.  Psychiatric:        Behavior: Behavior normal.        Judgment: Judgment normal.     LABORATORY DATA:  I have reviewed the data as listed Lab Results  Component Value Date   WBC 6.9 04/23/2024   HGB 10.3 (L) 04/23/2024   HCT 33.6 (L) 04/23/2024   MCV 60.2 (L) 04/23/2024   PLT 250 04/23/2024   Recent  Labs    06/03/23 1245 10/16/23 1034 04/23/24 1015  NA 136 134* 131*  K 3.5 4.0 3.8  CL 105 103 102  CO2 25 24 23   GLUCOSE 165* 102* 106*  BUN 10 17 10   CREATININE 0.55 0.64 0.70  CALCIUM  8.8* 8.9 8.5*  GFRNONAA >60 >60 >60  PROT  --  6.9  --   ALBUMIN  --  4.0  --   AST  --  14*  --   ALT  --  13  --   ALKPHOS  --  49  --   BILITOT  --  0.6  --     RADIOGRAPHIC STUDIES: I have personally reviewed the radiological images as listed and agreed with the findings in the report. No results found.  Symptomatic anemia #Symptomatic anemia-multifactorial iron  deficiency [thalassemia/menstrual]- ]iron  deficiency s/p IV iron  infusion.   #  Recommend proceed IV iron  infusion given Hb 10.4- on going  menstruating- proceed with venofer  today  # Abdominal pain [endometriosis Dr.Beasley-IBS-Dr.Vanga ] JUNE 2023- US  - fatty liver-  s/p GI evaluation-  Colonoscopy and Endoscopy July [facility]-dietary changes recommeded by GI   # Dyspnea/chest discomfort- Chronic fatigue/myalgias- Recommend B12 supplement/ vit D supplementation.  Recommend awaiting to cardiology evaluation/appt.   # DISPOSITION: # Interpreter # venofer  today # in follow up  6 months-  with APP- labs-cbc/bmp; iron  studies/ferritin; B12; vit D- 25-OH -- possible venofer -Dr.B     Cindy JONELLE Joe, MD 04/23/2024 1:09 PM

## 2024-06-02 ENCOUNTER — Other Ambulatory Visit: Payer: Self-pay | Admitting: Cardiology

## 2024-06-02 DIAGNOSIS — R079 Chest pain, unspecified: Secondary | ICD-10-CM

## 2024-06-02 DIAGNOSIS — I10 Essential (primary) hypertension: Secondary | ICD-10-CM

## 2024-06-02 DIAGNOSIS — Z8249 Family history of ischemic heart disease and other diseases of the circulatory system: Secondary | ICD-10-CM

## 2024-06-02 DIAGNOSIS — R0609 Other forms of dyspnea: Secondary | ICD-10-CM

## 2024-06-17 ENCOUNTER — Encounter (HOSPITAL_COMMUNITY): Payer: Self-pay

## 2024-06-18 ENCOUNTER — Telehealth (HOSPITAL_COMMUNITY): Payer: Self-pay | Admitting: *Deleted

## 2024-06-18 NOTE — Telephone Encounter (Signed)
 Reaching out to patient to offer assistance regarding upcoming cardiac imaging study; pt's friend verbalized verbalizes understanding of appt date/time, parking situation and where to check in, pre-test NPO status and medications ordered, and verified current allergies; name and call back number provided for further questions should they arise  Chantal Requena RN Navigator Cardiac Imaging [facility] [phone] office [phone] cell  Patient to take 50mg  metoprolol tartrate two hours prior to her cardiac CT scan.

## 2024-06-21 ENCOUNTER — Other Ambulatory Visit: Payer: Self-pay | Admitting: Gastroenterology

## 2024-06-21 ENCOUNTER — Ambulatory Visit
Admission: RE | Admit: 2024-06-21 | Discharge: 2024-06-21 | Disposition: A | Discharge status: Home / Self Care | Payer: MEDICAID | Source: Ambulatory Visit | Attending: Cardiology | Admitting: Cardiology

## 2024-06-21 DIAGNOSIS — Z8249 Family history of ischemic heart disease and other diseases of the circulatory system: Secondary | ICD-10-CM | POA: Insufficient documentation

## 2024-06-21 DIAGNOSIS — R079 Chest pain, unspecified: Secondary | ICD-10-CM | POA: Insufficient documentation

## 2024-06-21 DIAGNOSIS — I10 Essential (primary) hypertension: Secondary | ICD-10-CM | POA: Diagnosis present

## 2024-06-21 DIAGNOSIS — R0609 Other forms of dyspnea: Secondary | ICD-10-CM | POA: Insufficient documentation

## 2024-06-21 DIAGNOSIS — R1013 Epigastric pain: Secondary | ICD-10-CM

## 2024-06-21 MED ORDER — METOPROLOL TARTRATE 5 MG/5ML IV SOLN
10.0000 mg | Freq: Once | INTRAVENOUS | Status: DC | PRN
Start: 2024-06-21 — End: 2024-06-22
  Filled 2024-06-21: qty 10

## 2024-06-21 MED ORDER — DILTIAZEM HCL 25 MG/5ML IV SOLN
10.0000 mg | INTRAVENOUS | Status: DC | PRN
  Filled 2024-06-21: qty 5

## 2024-06-21 MED ORDER — IOHEXOL 350 MG/ML SOLN
100.0000 mL | Freq: Once | INTRAVENOUS | Status: AC | PRN
  Administered 2024-06-21: 100 mL via INTRAVENOUS

## 2024-06-21 MED ORDER — NITROGLYCERIN 0.4 MG SL SUBL
0.8000 mg | SUBLINGUAL_TABLET | Freq: Once | SUBLINGUAL | Status: AC
  Administered 2024-06-21: 0.8 mg via SUBLINGUAL
  Filled 2024-06-21: qty 25

## 2024-06-29 ENCOUNTER — Ambulatory Visit
Admission: RE | Admit: 2024-06-29 | Discharge: 2024-06-29 | Disposition: A | Discharge status: Home / Self Care | Payer: MEDICAID | Source: Ambulatory Visit | Attending: Gastroenterology | Admitting: Gastroenterology

## 2024-06-29 DIAGNOSIS — R1013 Epigastric pain: Secondary | ICD-10-CM | POA: Diagnosis present

## 2024-10-22 ENCOUNTER — Ambulatory Visit: Payer: MEDICAID | Admitting: Nurse Practitioner

## 2024-10-22 ENCOUNTER — Other Ambulatory Visit: Payer: MEDICAID

## 2024-10-22 ENCOUNTER — Ambulatory Visit: Payer: MEDICAID
